# Patient Record
Sex: Female | Born: 1945 | Race: Asian | Hispanic: No | Marital: Single | Smoking: Never smoker
Health system: Southern US, Community
[De-identification: ages and names within clinical notes are randomized; demographics above are authoritative.]

## PROBLEM LIST (undated history)

## (undated) DIAGNOSIS — E079 Disorder of thyroid, unspecified: Secondary | ICD-10-CM

## (undated) DIAGNOSIS — I10 Essential (primary) hypertension: Secondary | ICD-10-CM

## (undated) HISTORY — PX: ABDOMINAL HYSTERECTOMY: SHX81

---

## 2019-11-24 LAB — EXTERNAL GENERIC LAB PROCEDURE

## 2020-01-16 LAB — EXTERNAL GENERIC LAB PROCEDURE: COLOGUARD: NEGATIVE

## 2020-02-19 ENCOUNTER — Emergency Department
Admission: RE | Admit: 2020-02-19 | Discharge: 2020-02-19 | Disposition: A | Discharge status: Home / Self Care | Payer: Self-pay | Source: Ambulatory Visit | Attending: Family Medicine | Admitting: Family Medicine

## 2020-02-19 ENCOUNTER — Other Ambulatory Visit: Payer: Self-pay

## 2020-02-19 VITALS — BP 166/74 | HR 88 | Temp 97.7°F | Resp 16 | Ht 64.0 in | Wt 133.0 lb

## 2020-02-19 DIAGNOSIS — R0981 Nasal congestion: Secondary | ICD-10-CM | POA: Diagnosis not present

## 2020-02-19 HISTORY — DX: Essential (primary) hypertension: I10

## 2020-02-19 NOTE — ED Triage Notes (Signed)
Here for intermittent congestion over past month that clears with ibuprofen; started again 5 days ago. Denies fever or other symptoms. Has had covid and influenza vaccinations.

## 2020-02-19 NOTE — Discharge Instructions (Addendum)
Try taking Zyrtec 10mg  once daily, or Flonase nasal spray, 2 sprays in each nostril once daily (may also use both together).

## 2020-02-19 NOTE — ED Provider Notes (Signed)
[name]    CSN: [id number] Arrival date & time: 02/19/20  1355      History   Chief Complaint Chief Complaint  Patient presents with  . Nasal Congestion    HPI Laura Coleman is a 73 y.o. female.   Patient complains of intermittent nasal congestion that clears with ibuprofen for about a month.  Her symptoms recurred five days ago and have persisted.  She feels well otherwise without sore throat, facial pain, fever, cough, etc.  The history is provided by the patient.    Past Medical History:  Diagnosis Date  . Hypertension     There are no problems to display for this patient.   History reviewed. No pertinent surgical history.  OB History   No obstetric history on file.      Home Medications    Prior to Admission medications   Medication Sig Start Date End Date Taking? Authorizing Provider  hydrochlorothiazide (MICROZIDE) 12.5 MG capsule Take 12.5 mg by mouth daily.   Yes [provider]    Family History No family history on file.  Social History Social History   Tobacco Use  . Smoking status: Not on file  Substance Use Topics  . Alcohol use: Not on file  . Drug use: Not on file     Allergies   Patient has no known allergies.   Review of Systems Review of Systems No sore throat No cough No pleuritic pain No wheezing + nasal congestion No post-nasal drainage No sinus pain/pressure No itchy/red eyes No earache No hemoptysis No SOB No fever/chills No nausea No vomiting No abdominal pain No diarrhea No urinary symptoms No skin rash No fatigue No myalgias No headache   Physical Exam Triage Vital Signs ED Triage Vitals  Enc Vitals Group     BP 02/19/20 1416 (!) 192/84     Pulse Rate 02/19/20 1416 88     Resp 02/19/20 1416 16     Temp 02/19/20 1416 97.7 F (36.5 C)     Temp src --      SpO2 02/19/20 1416 100 %     Weight 02/19/20 1417 133 lb (60.3 kg)     Height 02/19/20 1417 5\' 4"  (1.626 m)     Head  Circumference --      Peak Flow --      Pain Score 02/19/20 1416 0     Pain Loc --      Pain Edu? --      Excl. in GC? --    No data found.  Updated Vital Signs BP (!) 166/74 (BP Location: Right Arm)   Pulse 88   Temp 97.7 F (36.5 C)   Resp 16   Ht 5\' 4"  (1.626 m)   Wt 60.3 kg   LMP 02/19/2020   SpO2 100%   BMI 22.83 kg/m   Visual Acuity Right Eye Distance:   Left Eye Distance:   Bilateral Distance:    Right Eye Near:   Left Eye Near:    Bilateral Near:     Physical Exam Nursing notes and Vital Signs reviewed. Appearance:  Patient appears stated age, and in no acute distress Eyes:  Pupils are equal, round, and reactive to light and accomodation.  Extraocular movement is intact.  Conjunctivae are not inflamed  Ears:  Canals normal.  Tympanic membranes normal.  Nose:  Mildly congested turbinates.  No sinus tenderness.  Pharynx:  Normal Neck:  Supple. No adenopathy. Lungs:  Clear to  auscultation.  Breath sounds are equal.  Moving air well. Heart:  Regular rate and rhythm without murmurs, rubs, or gallops.  Abdomen:  Nontender without masses or hepatosplenomegaly.  Bowel sounds are present.  No CVA or flank tenderness.  Extremities:  No edema.  Skin:  No rash present.   UC Treatments / Results  Labs (all labs ordered are listed, but only abnormal results are displayed) Labs Reviewed - No data to display  EKG   Radiology No results found.  Procedures Procedures (including critical care time)  Medications Ordered in UC Medications - No data to display  Initial Impression / Assessment and Plan / UC Course  I have reviewed the triage vital signs and the nursing notes.  Pertinent labs & imaging results that were available during my care of the patient were reviewed by me and considered in my medical decision making (see chart for details).    Suspect allergic rhinitis. Followup with ENT if not improving.   Final Clinical Impressions(s) / UC Diagnoses    Final diagnoses:  Nasal congestion     Discharge Instructions     Try taking Zyrtec 10mg  once daily, or Flonase nasal spray, 2 sprays in each nostril once daily (may also use both together).    ED Prescriptions    None        , MD 02/21/20 1236

## 2020-07-27 ENCOUNTER — Ambulatory Visit: Payer: Medicare Other

## 2021-05-07 ENCOUNTER — Emergency Department
Admission: RE | Admit: 2021-05-07 | Discharge: 2021-05-07 | Disposition: A | Discharge status: Home / Self Care | Payer: Medicare Other | Source: Ambulatory Visit

## 2021-05-07 ENCOUNTER — Other Ambulatory Visit: Payer: Self-pay

## 2021-05-07 VITALS — BP 162/82 | HR 93 | Temp 98.6°F | Resp 20 | Ht 64.0 in | Wt 133.0 lb

## 2021-05-07 DIAGNOSIS — R059 Cough, unspecified: Secondary | ICD-10-CM | POA: Diagnosis not present

## 2021-05-07 MED ORDER — BENZONATATE 200 MG PO CAPS: 200.0000 mg | ORAL_CAPSULE | Freq: Three times a day (TID) | ORAL | 0 refills | Status: AC | PRN

## 2021-05-07 MED ORDER — AMOXICILLIN-POT CLAVULANATE 875-125 MG PO TABS: 1.0000 | ORAL_TABLET | Freq: Two times a day (BID) | ORAL | 0 refills | Status: DC

## 2021-05-07 NOTE — ED Provider Notes (Signed)
[facility]    CSN: [id number] Arrival date & time: 05/07/21  1149      History   Chief Complaint Chief Complaint  Patient presents with   Cough    HPI Laura Coleman is a 76 y.o. female.   HPI 76 year old female presents with cough for 6 days.  Denies fever.  Reports mild nasal congestion during this time and negative home COVID-19 test.  PMH significant for HTN.  Past Medical History:  Diagnosis Date   Hypertension     There are no problems to display for this patient.   History reviewed. No pertinent surgical history.  OB History   No obstetric history on file.      Home Medications    Prior to Admission medications   Medication Sig Start Date End Date Taking? Authorizing Provider  amoxicillin-clavulanate (AUGMENTIN) 875-125 MG tablet Take 1 tablet by mouth every 12 (twelve) hours. 05/07/21  Yes Trevor Iha, FNP  benzonatate (TESSALON) 200 MG capsule Take 1 capsule (200 mg total) by mouth 3 (three) times daily as needed for up to 7 days for cough. 05/07/21 05/14/21 Yes Trevor Iha, FNP  hydrochlorothiazide (MICROZIDE) 12.5 MG capsule Take 12.5 mg by mouth daily.    [provider]  losartan (COZAAR) 25 MG tablet Take 25 mg by mouth daily. 04/30/21   [provider]    Family History Family History  Problem Relation Age of Onset   Cancer Mother    Osteoporosis Mother    Pneumonia Father     Social History Social History   Tobacco Use   Smoking status: Never   Smokeless tobacco: Never  Vaping Use   Vaping Use: Never used  Substance Use Topics   Alcohol use: Not Currently   Drug use: Not Currently     Allergies   Patient has no known allergies.   Review of Systems Review of Systems  HENT:  Positive for congestion.   Respiratory:  Positive for cough.   All other systems reviewed and are negative.   Physical Exam Triage Vital Signs ED Triage Vitals  Enc Vitals Group     BP 05/07/21 1203 (!) 189/81     Pulse  Rate 05/07/21 1203 93     Resp 05/07/21 1203 20     Temp 05/07/21 1203 98.6 F (37 C)     Temp Source 05/07/21 1203 Oral     SpO2 05/07/21 1203 100 %     Weight 05/07/21 1200 133 lb (60.3 kg)     Height 05/07/21 1200 5\' 4"  (1.626 m)     Head Circumference --      Peak Flow --      Pain Score 05/07/21 1200 0     Pain Loc --      Pain Edu? --      Excl. in GC? --    No data found.  Updated Vital Signs BP (!) 162/82 (BP Location: Right Arm)    Pulse 93    Temp 98.6 F (37 C) (Oral)    Resp 20    Ht 5\' 4"  (1.626 m)    Wt 133 lb (60.3 kg)    LMP 02/19/2020    SpO2 100%    BMI 22.83 kg/m      Physical Exam Vitals and nursing note reviewed.  Constitutional:      General: She is not in acute distress.    Appearance: Normal appearance. She is normal weight. She is not ill-appearing.  HENT:  Head: Normocephalic and atraumatic.     Right Ear: Tympanic membrane, ear canal and external ear normal.     Left Ear: Tympanic membrane, ear canal and external ear normal.     Nose: Nose normal.     Mouth/Throat:     Mouth: Mucous membranes are moist.     Pharynx: Oropharynx is clear.  Eyes:     Extraocular Movements: Extraocular movements intact.     Conjunctiva/sclera: Conjunctivae normal.     Pupils: Pupils are equal, round, and reactive to light.  Cardiovascular:     Rate and Rhythm: Normal rate and regular rhythm.     Pulses: Normal pulses.     Heart sounds: Normal heart sounds.  Pulmonary:     Effort: Pulmonary effort is normal.     Breath sounds: Normal breath sounds. No wheezing, rhonchi or rales.     Comments: Infrequent nonproductive cough noted on exam Musculoskeletal:     Cervical back: Normal range of motion and neck supple.  Skin:    General: Skin is warm and dry.  Neurological:     General: No focal deficit present.     Mental Status: She is alert and oriented to person, place, and time. Mental status is at baseline.     UC Treatments / Results  Labs (all labs  ordered are listed, but only abnormal results are displayed) Labs Reviewed - No data to display  EKG   Radiology No results found.  Procedures Procedures (including critical care time)  Medications Ordered in UC Medications - No data to display  Initial Impression / Assessment and Plan / UC Course  I have reviewed the triage vital signs and the nursing notes.  Pertinent labs & imaging results that were available during my care of the patient were reviewed by me and considered in my medical decision making (see chart for details).     MDM: 1. Cough-Rx'd Augmentin and Tessalon Perles. Advised patient to take medication as directed with food to completion.  Advised patient may take Tessalon Perles daily or as needed for cough.  Encouraged patient to increase daily water intake while taking these medications.  Patient discharged home, hemodynamically stable. Final Clinical Impressions(s) / UC Diagnoses   Final diagnoses:  Cough, unspecified type     Discharge Instructions      Advised patient to take medication as directed with food to completion.  Advised patient may take Tessalon Perles daily or as needed for cough.  Encouraged patient increase daily water intake while taking these medications.     ED Prescriptions     Medication Sig Dispense Auth. Provider   amoxicillin-clavulanate (AUGMENTIN) 875-125 MG tablet Take 1 tablet by mouth every 12 (twelve) hours. 14 tablet Trevor Iha, FNP   benzonatate (TESSALON) 200 MG capsule Take 1 capsule (200 mg total) by mouth 3 (three) times daily as needed for up to 7 days for cough. 40 capsule Trevor Iha, FNP      PDMP not reviewed this encounter.   Trevor Iha, FNP 05/07/21 1301

## 2021-05-07 NOTE — ED Triage Notes (Signed)
Pt presents to Urgent Care with c/o cough x 6 days. Afebrile. Also c/o mild nasal congestion. Reports negative home COVID test.

## 2021-05-07 NOTE — Discharge Instructions (Addendum)
Advised patient to take medication as directed with food to completion.  Advised patient may take Tessalon Perles daily or as needed for cough.  Encouraged patient increase daily water intake while taking these medications.

## 2021-07-08 ENCOUNTER — Emergency Department (HOSPITAL_COMMUNITY)
Admission: EM | Admit: 2021-07-08 | Discharge: 2021-07-09 | Discharge status: Against Medical Advice | Payer: Medicare Other | Attending: Emergency Medicine | Admitting: Emergency Medicine

## 2021-07-08 ENCOUNTER — Other Ambulatory Visit: Payer: Self-pay

## 2021-07-08 ENCOUNTER — Encounter (HOSPITAL_COMMUNITY): Payer: Self-pay

## 2021-07-08 ENCOUNTER — Emergency Department (HOSPITAL_COMMUNITY): Payer: Medicare Other

## 2021-07-08 DIAGNOSIS — S80819A Abrasion, unspecified lower leg, initial encounter: Secondary | ICD-10-CM | POA: Insufficient documentation

## 2021-07-08 DIAGNOSIS — Y92481 Parking lot as the place of occurrence of the external cause: Secondary | ICD-10-CM | POA: Diagnosis not present

## 2021-07-08 DIAGNOSIS — M25561 Pain in right knee: Secondary | ICD-10-CM | POA: Diagnosis not present

## 2021-07-08 DIAGNOSIS — Z5321 Procedure and treatment not carried out due to patient leaving prior to being seen by health care provider: Secondary | ICD-10-CM | POA: Insufficient documentation

## 2021-07-08 DIAGNOSIS — S8990XA Unspecified injury of unspecified lower leg, initial encounter: Secondary | ICD-10-CM | POA: Diagnosis present

## 2021-07-08 LAB — URINALYSIS, ROUTINE W REFLEX MICROSCOPIC
Bilirubin Urine: NEGATIVE
Glucose, UA: NEGATIVE mg/dL
Ketones, ur: NEGATIVE mg/dL
Nitrite: NEGATIVE
Protein, ur: NEGATIVE mg/dL
Specific Gravity, Urine: 1.008 (ref 1.005–1.030)
pH: 6 (ref 5.0–8.0)

## 2021-07-08 LAB — BASIC METABOLIC PANEL
Anion gap: 9 (ref 5–15)
BUN: 19 mg/dL (ref 8–23)
CO2: 23 mmol/L (ref 22–32)
Calcium: 8.9 mg/dL (ref 8.9–10.3)
Chloride: 102 mmol/L (ref 98–111)
Creatinine, Ser: 1.05 mg/dL — ABNORMAL HIGH (ref 0.44–1.00)
GFR, Estimated: 55 mL/min — ABNORMAL LOW (ref >60)
Glucose, Bld: 146 mg/dL — ABNORMAL HIGH (ref 70–99)
Potassium: 3.8 mmol/L (ref 3.5–5.1)
Sodium: 134 mmol/L — ABNORMAL LOW (ref 135–145)

## 2021-07-08 LAB — CBC
HCT: 39.2 % (ref 36.0–46.0)
Hemoglobin: 12.9 g/dL (ref 12.0–15.0)
MCH: 28.7 pg (ref 26.0–34.0)
MCHC: 32.9 g/dL (ref 30.0–36.0)
MCV: 87.3 fL (ref 80.0–100.0)
Platelets: 294 10*3/uL (ref 150–400)
RBC: 4.49 MIL/uL (ref 3.87–5.11)
RDW: 13.2 % (ref 11.5–15.5)
WBC: 16.5 10*3/uL — ABNORMAL HIGH (ref 4.0–10.5)
nRBC: 0 % (ref 0.0–0.2)
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
## 2021-07-09 ENCOUNTER — Emergency Department
Admission: EM | Admit: 2021-07-09 | Discharge: 2021-07-09 | Disposition: A | Discharge status: Home / Self Care | Payer: Medicare Other | Source: Home / Self Care | Attending: Family Medicine | Admitting: Family Medicine

## 2021-07-09 ENCOUNTER — Ambulatory Visit: Payer: Self-pay

## 2021-07-09 DIAGNOSIS — M7989 Other specified soft tissue disorders: Secondary | ICD-10-CM

## 2021-07-09 DIAGNOSIS — M25561 Pain in right knee: Secondary | ICD-10-CM

## 2021-07-09 DIAGNOSIS — Z23 Encounter for immunization: Secondary | ICD-10-CM

## 2021-07-09 DIAGNOSIS — S8701XA Crushing injury of right knee, initial encounter: Secondary | ICD-10-CM | POA: Diagnosis not present

## 2021-07-09 MED ORDER — TETANUS-DIPHTH-ACELL PERTUSSIS 5-2.5-18.5 LF-MCG/0.5 IM SUSY
0.5000 mL | PREFILLED_SYRINGE | Freq: Once | INTRAMUSCULAR | Status: AC
Start: 2021-07-09 — End: 2021-07-09
  Administered 2021-07-09: 0.5 mL via INTRAMUSCULAR

## 2021-07-09 MED ORDER — MUPIROCIN 2 % EX OINT: 1.0000 "application " | TOPICAL_OINTMENT | Freq: Two times a day (BID) | CUTANEOUS | 0 refills | Status: DC
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
## 2021-07-09 NOTE — ED Notes (Signed)
Patient states she is leaving d/t wait time 

## 2021-07-09 NOTE — Discharge Instructions (Addendum)
Limit your walking.  Walk only as much as you must to get meals and use the bathroom ?Spend most of your time reclining with your leg elevated above the level of your heart ?Gently move the joints of your leg, knee and ankle, to keep motion ?May use warm compresses to area ?Cleaned the open wounds twice a day and apply antibiotic ointment ?See Dr. Jordan Likes next week ?GO TO ER if the foot becomes numb or blue ?
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
## 2021-07-14 ENCOUNTER — Encounter: Payer: Self-pay | Admitting: Family Medicine

## 2021-07-14 ENCOUNTER — Ambulatory Visit: Payer: Self-pay

## 2021-07-14 ENCOUNTER — Ambulatory Visit: Payer: Medicare Other | Admitting: Family Medicine

## 2021-07-14 VITALS — BP 128/80 | Ht 64.0 in | Wt 130.0 lb

## 2021-07-14 DIAGNOSIS — M25561 Pain in right knee: Secondary | ICD-10-CM

## 2021-07-14 DIAGNOSIS — S8781XD Crushing injury of right lower leg, subsequent encounter: Secondary | ICD-10-CM | POA: Insufficient documentation

## 2021-07-14 DIAGNOSIS — T148XXA Other injury of unspecified body region, initial encounter: Secondary | ICD-10-CM

## 2021-07-14 DIAGNOSIS — M25461 Effusion, right knee: Secondary | ICD-10-CM | POA: Diagnosis not present

## 2021-07-14 DIAGNOSIS — S8781XA Crushing injury of right lower leg, initial encounter: Secondary | ICD-10-CM | POA: Diagnosis not present

## 2021-07-14 NOTE — Assessment & Plan Note (Signed)
Initial injury on 4/5.  Having improving this and swelling.  Denies any paresthesia in the lower leg.  Does have some chronic kidney disease from the lab work obtained in the emergency department. ?-Counseled on home exercise therapy and supportive care. ?-CMP, CBC, CK, magnesium  ?-Counseled on need for close follow-up ?

## 2021-07-14 NOTE — Patient Instructions (Signed)
Nice to meet you ?Please try light heat  ?Please try the to do normal activities  ?I will call with the lab results.   ?Please send me a message in MyChart with any questions or updates.  ?Please see me back in 1-2 weeks.  ? ?--Dr. Jordan Likes ? ?
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
## 2021-07-14 NOTE — Assessment & Plan Note (Signed)
Acutely occurring around the knee since her injury.  Does not appear to be infected on exam. ?-Counseled on home exercise therapy and supportive care. ?-Could consider aspiration. ?

## 2021-07-14 NOTE — Assessment & Plan Note (Signed)
Acutely occurring.  Does have a mild effusion but does not appear to be the source of her pain following the trauma.  She has limited range of motion today. ?-Counseled on home exercise therapy and supportive care. ?-Could consider physical therapy or injection. ?

## 2021-07-15 LAB — COMPREHENSIVE METABOLIC PANEL
ALT: 16 IU/L (ref 0–32)
AST: 27 IU/L (ref 0–40)
Albumin/Globulin Ratio: 1.7 (ref 1.2–2.2)
Albumin: 4.2 g/dL (ref 3.7–4.7)
Alkaline Phosphatase: 103 IU/L (ref 44–121)
BUN/Creatinine Ratio: 13 (ref 12–28)
BUN: 12 mg/dL (ref 8–27)
Bilirubin Total: 0.8 mg/dL (ref 0.0–1.2)
CO2: 23 mmol/L (ref 20–29)
Calcium: 9.6 mg/dL (ref 8.7–10.3)
Chloride: 102 mmol/L (ref 96–106)
Creatinine, Ser: 0.9 mg/dL (ref 0.57–1.00)
Globulin, Total: 2.5 g/dL (ref 1.5–4.5)
Glucose: 108 mg/dL — ABNORMAL HIGH (ref 70–99)
Potassium: 4.8 mmol/L (ref 3.5–5.2)
Sodium: 140 mmol/L (ref 134–144)
Total Protein: 6.7 g/dL (ref 6.0–8.5)
eGFR: 66 mL/min/{1.73_m2} (ref >59)

## 2021-07-15 LAB — CBC WITH DIFFERENTIAL
Basophils Absolute: 0 10*3/uL (ref 0.0–0.2)
Basos: 0 %
EOS (ABSOLUTE): 0.1 10*3/uL (ref 0.0–0.4)
Eos: 1 %
Hematocrit: 34.2 % (ref 34.0–46.6)
Hemoglobin: 11.6 g/dL (ref 11.1–15.9)
Immature Grans (Abs): 0.1 10*3/uL (ref 0.0–0.1)
Immature Granulocytes: 1 %
Lymphocytes Absolute: 1.2 10*3/uL (ref 0.7–3.1)
Lymphs: 13 %
MCH: 28.6 pg (ref 26.6–33.0)
MCHC: 33.9 g/dL (ref 31.5–35.7)
MCV: 84 fL (ref 79–97)
Monocytes Absolute: 0.6 10*3/uL (ref 0.1–0.9)
Monocytes: 6 %
Neutrophils Absolute: 7.7 10*3/uL — ABNORMAL HIGH (ref 1.4–7.0)
Neutrophils: 79 %
RBC: 4.06 x10E6/uL (ref 3.77–5.28)
RDW: 13.2 % (ref 11.7–15.4)
WBC: 9.6 10*3/uL (ref 3.4–10.8)

## 2021-07-15 LAB — MAGNESIUM: Magnesium: 2.3 mg/dL (ref 1.6–2.3)

## 2021-07-15 LAB — CK: Total CK: 85 U/L (ref 32–182)

## 2021-07-20 ENCOUNTER — Telehealth: Payer: Self-pay | Admitting: Family Medicine

## 2021-07-20 NOTE — Telephone Encounter (Signed)
Left VM for patient. If she calls back please have her speak with a nurse/CMA and inform that her lab values are reassuring and we'll follow up as scheduled.  ? ?If any questions then please take the best time and phone number to call and I will try to call her back.  ? ?Rosemarie Ax, MD ?[facility] ?07/20/2021, 1:00 PM ? ? ?

## 2021-07-20 NOTE — Telephone Encounter (Signed)
Pt informed of below.  

## 2021-07-20 NOTE — Telephone Encounter (Signed)
Pt called for lab results--- Forwarding message to med asst to contact pt if Labs cleared by provider. ? ?--glh ?

## 2021-07-21 ENCOUNTER — Ambulatory Visit: Payer: Medicare Other | Admitting: Family Medicine

## 2021-07-23 ENCOUNTER — Ambulatory Visit: Payer: Medicare Other | Admitting: Family Medicine

## 2021-07-23 ENCOUNTER — Encounter: Payer: Self-pay | Admitting: Family Medicine

## 2021-07-23 ENCOUNTER — Ambulatory Visit: Payer: Self-pay

## 2021-07-23 DIAGNOSIS — T148XXA Other injury of unspecified body region, initial encounter: Secondary | ICD-10-CM | POA: Diagnosis not present

## 2021-07-23 DIAGNOSIS — S8781XD Crushing injury of right lower leg, subsequent encounter: Secondary | ICD-10-CM | POA: Diagnosis not present

## 2021-07-23 NOTE — Assessment & Plan Note (Signed)
Acutely occurring.  Lab work was reassuring with a normal CK.  She does have a wound on the medial and lateral aspect around the knee which shows good granulation tissue.  No signs of infection. ?-Counseled on home exercise therapy and supportive care. ?-Referral to wound care center for evaluation of each wound due to their fairly large size and possible need for debridement and maintenance. ?

## 2021-07-23 NOTE — Assessment & Plan Note (Signed)
Still acutely occurring.  Able to aspirate a fair amount today. ?-Counseled on home exercise therapy and supportive care. ?-Aspiration. ?-Referral to physical therapy. ? ?

## 2021-07-23 NOTE — Patient Instructions (Signed)
Good to see you ?Continue to be active  ?We have made a referral to physical therapy  ?We have made a referral to wound care center   ?Please send me a message in MyChart with any questions or updates.  ?Please see me back in 3 weeks.  ? ?--Dr. Jordan Likes ? ?

## 2021-07-23 NOTE — Progress Notes (Signed)
?  Laura Coleman - 76 y.o. female MRN [id number]  Date of birth: 06-12-1945 ? ?SUBJECTIVE:  Including CC & ROS.  ?No chief complaint on file. ? ? ?Laura Coleman is a 76 y.o. female that is following up for her right leg pain, hematoma and crush injury.  She has slowly gotten improvement with the pain and swelling.  She is still having some pain over the medial aspect of the knee.  Her wounds are continuing to heal. ? ? ?Review of Systems ?See HPI  ? ?HISTORY: Past Medical, Surgical, Social, and Family History Reviewed & Updated per EMR.   ?Pertinent Historical Findings include: ? ?Past Medical History:  ?Diagnosis Date  ? Hypertension   ? ? ?History reviewed. No pertinent surgical history. ? ? ?PHYSICAL EXAM:  ?VS: LMP 02/19/2020  ?Physical Exam ?Gen: NAD, alert, cooperative with exam, well-appearing ?MSK:  ?Neurovascularly intact   ? ? ?Aspiration/Injection Procedure Note ?Ariannah Arenson ?[birth year] ? ?Procedure: Aspiration ?Indications: Right knee pain ? ?Procedure Details ?Consent: Risks of procedure as well as the alternatives and risks of each were explained to the (patient/caregiver).  Consent for procedure obtained. ?Time Out: Verified patient identification, verified procedure, site/side was marked, verified correct patient position, special equipment/implants available, medications/allergies/relevent history reviewed, required imaging and test results available.  Performed.  The area was cleaned with iodine and alcohol swabs.   ? ?The right medial knee soft tissue was injected using 3 cc's of 1% lidocaine with a 25 1 1/2" needle.  An 18-gauge 1 and 1 officiated was used to achieve aspiration.  Ultrasound was used. Images were obtained in long views showing the injection.   ? ?Amount of Fluid Aspirated:  95mL ?Character of Fluid: bloody ?Fluid was sent for: n/a ? ?A sterile dressing was applied. ? ?Patient did tolerate procedure well. ? ? ? ? ?ASSESSMENT & PLAN:  ? ?Hematoma ?Still acutely occurring.  Able to aspirate a  fair amount today. ?-Counseled on home exercise therapy and supportive care. ?-Aspiration. ?-Referral to physical therapy. ? ? ?Crush injury, leg, lower, right, subsequent encounter ?Acutely occurring.  Lab work was reassuring with a normal CK.  She does have a wound on the medial and lateral aspect around the knee which shows good granulation tissue.  No signs of infection. ?-Counseled on home exercise therapy and supportive care. ?-Referral to wound care center for evaluation of each wound due to their fairly large size and possible need for debridement and maintenance. ? ? ? ? ?

## 2021-07-28 ENCOUNTER — Ambulatory Visit: Payer: Medicare Other | Attending: Family Medicine | Admitting: Rehabilitative and Restorative Service Providers"

## 2021-07-28 ENCOUNTER — Telehealth: Payer: Self-pay | Admitting: Family Medicine

## 2021-07-28 DIAGNOSIS — S8001XA Contusion of right knee, initial encounter: Secondary | ICD-10-CM | POA: Diagnosis not present

## 2021-07-28 DIAGNOSIS — M25461 Effusion, right knee: Secondary | ICD-10-CM | POA: Insufficient documentation

## 2021-07-28 DIAGNOSIS — Y929 Unspecified place or not applicable: Secondary | ICD-10-CM | POA: Diagnosis not present

## 2021-07-28 DIAGNOSIS — R29898 Other symptoms and signs involving the musculoskeletal system: Secondary | ICD-10-CM | POA: Diagnosis not present

## 2021-07-28 DIAGNOSIS — S8701XA Crushing injury of right knee, initial encounter: Secondary | ICD-10-CM | POA: Diagnosis not present

## 2021-07-28 DIAGNOSIS — M25561 Pain in right knee: Secondary | ICD-10-CM | POA: Insufficient documentation

## 2021-07-28 DIAGNOSIS — T148XXA Other injury of unspecified body region, initial encounter: Secondary | ICD-10-CM | POA: Diagnosis present

## 2021-07-28 DIAGNOSIS — R600 Localized edema: Secondary | ICD-10-CM | POA: Diagnosis not present

## 2021-07-28 DIAGNOSIS — Y939 Activity, unspecified: Secondary | ICD-10-CM | POA: Diagnosis not present

## 2021-07-28 DIAGNOSIS — M25661 Stiffness of right knee, not elsewhere classified: Secondary | ICD-10-CM | POA: Insufficient documentation
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
## 2021-07-28 NOTE — Telephone Encounter (Signed)
Per pt no contact from [facility]--faxing order & OV notes to [phone] instead of [phone] non working no#s. ? ?--Advised pt to contact [facility] if no cll by end of week. ? ?--Just an FYI  ? ?-glh ?

## 2021-07-28 NOTE — Patient Instructions (Signed)
Access Code: [email or web address] ?URL: [email or web address] ?Date: 07/28/2021 ?Prepared by: Corlis Leak ? ?Exercises ?- Hooklying Hamstring Stretch with Strap  - 2 x daily - 7 x weekly - 1 sets - 3 reps - 30 sec  hold ?- Supine ITB Stretch with Strap  - 2 x daily - 7 x weekly - 1 sets - 3 reps - 30 sec  hold ?- Supine Piriformis Stretch with Leg Straight  - 2 x daily - 7 x weekly - 1 sets - 3 reps - 30 sec  hold ?- Hip Flexor Stretch at Edge of Bed  - 2 x daily - 7 x weekly - 1 sets - 3 reps - 30 sec  hold ?- Hooklying Single Knee to Chest Stretch  - 2 x daily - 7 x weekly - 1 sets - 3-5 reps - 10-15 sec  hold ?- Supine Quad Set  - 2 x daily - 7 x weekly - 1 sets - 10 reps - 3 sec  hold ?- Small Range Straight Leg Raise  - 2 x daily - 7 x weekly - 1 sets - 10 reps - 5 sec  hold ?- Ankle Pumps in Elevation  - 2 x daily - 7 x weekly - 1 sets - 10-20 reps ?- Ankle Circles in Elevation  - 2 x daily - 7 x weekly - 1 sets - 10-15 reps ?- Ankle Alphabet in Elevation  - 2 x daily - 7 x weekly - 1 sets - 3 reps ?

## 2021-07-30 ENCOUNTER — Encounter: Payer: Self-pay | Admitting: Rehabilitative and Restorative Service Providers"

## 2021-07-30 ENCOUNTER — Ambulatory Visit: Payer: Medicare Other | Admitting: Rehabilitative and Restorative Service Providers"

## 2021-07-30 DIAGNOSIS — R29898 Other symptoms and signs involving the musculoskeletal system: Secondary | ICD-10-CM

## 2021-07-30 DIAGNOSIS — M25661 Stiffness of right knee, not elsewhere classified: Secondary | ICD-10-CM

## 2021-07-30 DIAGNOSIS — S8001XA Contusion of right knee, initial encounter: Secondary | ICD-10-CM | POA: Diagnosis not present

## 2021-07-30 NOTE — Therapy (Signed)
[place] ?[facility] ?[address] [place] [address] ?[place], [place], [address] ?Phone: [phone]   Fax:  [phone] ? ?Physical Therapy Treatment ? ?Patient Details  ?Name: Laura Coleman ?MRN: [id number] ?Date of Birth: 1945/12/09 ?Referring Provider (PT): Dr Binnie Rail ? ? ?Encounter Date: 07/30/2021 ? ? PT End of Session - 07/30/21 3614   ? ? Visit Number 2   ? Number of Visits 12   ? Date for PT Re-Evaluation 09/08/21   ? Progress Note Due on Visit 10   ? PT Start Time [phone]   ? PT Stop Time 0930   ? PT Time Calculation (min) 40 min   ? Activity Tolerance Patient tolerated treatment well   ? ?  ?  ? ?  ? ? ?Past Medical History:  ?Diagnosis Date  ? Hypertension   ? ? ?History reviewed. No pertinent surgical history. ? ?There were no vitals filed for this visit. ? ? Subjective Assessment - 07/30/21 0857   ? ? Subjective Working on her exercises at home. Not as bad in the morning but worse at night. Leg feels tight. Patient has an appointment with wound care center today.   ? Currently in Pain? No/denies   ? Pain Score 0-No pain   ? ?  ?  ? ?  ? ? ? ? ? ? ? ? ? ? ? ? ? ? ? ? ? ? ? ? [facility] Adult PT Treatment/Exercise - 07/30/21 0001   ? ?  ? Knee/Hip Exercises: Stretches  ? Passive Hamstring Stretch Right;2 reps;30 seconds   ? Passive Hamstring Stretch Limitations supine with strap   ? Hip Flexor Stretch Right;3 reps;30 seconds   ? Hip Flexor Stretch Limitations sitting   ? Knee: Self-Stretch to increase Flexion Right;5 reps;10 seconds   ? Knee: Self-Stretch Limitations supine knee to chest   ? ITB Stretch Right;2 reps;30 seconds   ? ITB Stretch Limitations supine with strap   ? Piriformis Stretch Right;2 reps;30 seconds   ? Piriformis Stretch Limitations supine travell   ? Gastroc Stretch Right;2 reps;30 seconds   ? Soleus Stretch Right;2 reps;30 seconds   ?  ? Knee/Hip Exercises: Aerobic  ? Nustep L5 x 6 min UE 10   ?  ? Knee/Hip Exercises: Standing  ? Functional Squat 10 reps;3  seconds   ? SLS 20 sec x 3 reps Rt LE   ?  ? Knee/Hip Exercises: Supine  ? Quad Sets AROM;Strengthening;Right;10 reps   ? Quad Sets Limitations 5 sec hold   ? Bridges Strengthening;Both;10 reps   5 sec hold  ? Straight Leg Raises AROM;Strengthening;Right;10 reps   ? Straight Leg Raises Limitations 5 sec hold 10 inch lift   ? Other Supine Knee/Hip Exercises ankle pumps; ankle circles x 10-20 reps each; alphabet   ? ?  ?  ? ?  ? ? ? ? ? ? ? ? ? ? ? ? ? ? ? PT Long Term Goals - 07/28/21 1018   ? ?  ? PT LONG TERM GOAL #1  ? Title Increase AROM Rt knee to 0 degrees extension   ? Time 6   ? Period Weeks   ? Status New   ? Target Date 09/08/21   ?  ? PT LONG TERM GOAL #2  ? Title Improve functional strength with patient reporting return to full normal activites   ? Time 6   ? Period Weeks   ? Status New   ? Target Date 09/08/21   ?  ?  PT LONG TERM GOAL #3  ? Title Independent in HEP   ? Time 6   ? Period Weeks   ? Status New   ? Target Date 09/08/21   ? ?  ?  ? ?  ? ? ? ? ? ? ? ? Plan - 07/30/21 0858   ? ? Clinical Impression Statement Patient is working on exercises at home. She has improved color and decreased edema Lt LE compared to initial visit. Continued with ROM and strengthening exercises with correction and review as indicated.   ? Rehab Potential Good   ? PT Frequency 2x / week   ? PT Duration 6 weeks   ? PT Treatment/Interventions ADLs/Self Care Home Management;Aquatic Therapy;Cryotherapy;Electrical Stimulation;Iontophoresis 4mg /ml Dexamethasone;Moist Heat;Ultrasound;Gait training;Stair training;Functional mobility training;Therapeutic activities;Therapeutic exercise;Balance training;Neuromuscular re-education;Patient/family education;Manual techniques;Passive range of motion;Dry needling;Taping;Vasopneumatic Device   ? PT Next Visit Plan review and progress HEP; continue with work to increase AROM Rt hip and knee, improve functional strength, increase functional activity level. Modalities as indicated   ? PT  Home Exercise Plan [email or web address]   ? Consulted and Agree with Plan of Care Patient   ? ?  ?  ? ?  ? ? ?Patient will benefit from skilled therapeutic intervention in order to improve the following deficits and impairments:    ? ?Visit Diagnosis: ?Stiffness of right knee, not elsewhere classified ? ?Other symptoms and signs involving the musculoskeletal system ? ? ? ? ?Problem List ?Patient Active Problem List  ? Diagnosis Date Noted  ? Crush injury, leg, lower, right, subsequent encounter 07/14/2021  ? Hematoma 07/14/2021  ? Effusion of right knee 07/14/2021  ? ? ?09/13/2021, PT, MPH  ?07/30/2021, 9:32 AM ? ?[place] ?[facility] ?[address] ?[place], [place], [place] ?Phone: [phone]   Fax:  [phone] ? ?Name: Shawn Dannenberg ?MRN: [name] ?Date of Birth: [birth year] ? ? ? ?

## 2021-08-04 ENCOUNTER — Encounter: Payer: Self-pay | Admitting: Rehabilitative and Restorative Service Providers"

## 2021-08-04 ENCOUNTER — Ambulatory Visit: Payer: Medicare Other | Attending: Family Medicine | Admitting: Rehabilitative and Restorative Service Providers"

## 2021-08-04 DIAGNOSIS — R29898 Other symptoms and signs involving the musculoskeletal system: Secondary | ICD-10-CM | POA: Diagnosis present

## 2021-08-04 DIAGNOSIS — M25661 Stiffness of right knee, not elsewhere classified: Secondary | ICD-10-CM | POA: Insufficient documentation

## 2021-08-04 NOTE — Patient Instructions (Signed)
Access Code: [email or web address] ?URL: [email or web address] ?Date: 08/04/2021 ?Prepared by: Gillermo Murdoch ? ?Exercises ?- Hooklying Hamstring Stretch with Strap  - 2 x daily - 7 x weekly - 1 sets - 3 reps - 30 sec  hold ?- Supine ITB Stretch with Strap  - 2 x daily - 7 x weekly - 1 sets - 3 reps - 30 sec  hold ?- Supine Piriformis Stretch with Leg Straight  - 2 x daily - 7 x weekly - 1 sets - 3 reps - 30 sec  hold ?- Hip Flexor Stretch at Edge of Bed  - 2 x daily - 7 x weekly - 1 sets - 3 reps - 30 sec  hold ?- Hooklying Single Knee to Chest Stretch  - 2 x daily - 7 x weekly - 1 sets - 3-5 reps - 10-15 sec  hold ?- Supine Quad Set  - 2 x daily - 7 x weekly - 1 sets - 10 reps - 3 sec  hold ?- Small Range Straight Leg Raise  - 2 x daily - 7 x weekly - 1 sets - 10 reps - 5 sec  hold ?- Ankle Pumps in Elevation  - 2 x daily - 7 x weekly - 1 sets - 10-20 reps ?- Ankle Circles in Elevation  - 2 x daily - 7 x weekly - 1 sets - 10-15 reps ?- Ankle Alphabet in Elevation  - 2 x daily - 7 x weekly - 1 sets - 3 reps ?- Supine Quad Set  - 2 x daily - 7 x weekly - 1 sets - 10 reps - 3 sec  hold ?- Seated Hip Flexor Stretch  - 2 x daily - 7 x weekly - 1 sets - 3 reps - 30 sec  hold ?- Gastroc Stretch on Wall  - 2 x daily - 7 x weekly - 1 sets - 3 reps - 30 sec  hold ?- Soleus Stretch on Wall  - 2 x daily - 7 x weekly - 1 sets - 3 reps - 30 sec  hold ?- Single Leg Stance with Support  - 2 x daily - 7 x weekly - 1 sets - 5 reps - 15-20 sec  hold ?- Mini Squat with Counter Support  - 2 x daily - 7 x weekly - 1-2 sets - 10 reps - 3 sec  hold ?- Standing Hip Extension with Counter Support  - 2 x daily - 7 x weekly - 1-2 sets - 10 reps - 3-5 sec  hold ?- Standing Hip Abduction with Counter Support  - 2 x daily - 7 x weekly - 2-3 sets - 10 reps - 2-3 sec  hold ?

## 2021-08-04 NOTE — Therapy (Signed)
[place] ?[facility] ?[address] [place] [address] ?[place], [place], [address] ?Phone: [phone]   Fax:  [phone] ? ?Physical Therapy Treatment ? ?Patient Details  ?Name: Laura Coleman ?MRN: [id number] ?Date of Birth: [birth year] ?Referring Provider (PT): Dr Binnie Rail ? ? ?Encounter Date: 08/04/2021 ? ? PT End of Session - 08/04/21 0936   ? ? Visit Number 3   ? Number of Visits 12   ? Date for PT Re-Evaluation 09/08/21   ? Progress Note Due on Visit 10   ? PT Start Time 0935   ? PT Stop Time 1015   ? PT Time Calculation (min) 40 min   ? Activity Tolerance Patient tolerated treatment well   ? ?  ?  ? ?  ? ? ?Past Medical History:  ?Diagnosis Date  ? Hypertension   ? ? ?History reviewed. No pertinent surgical history. ? ?There were no vitals filed for this visit. ? ? Subjective Assessment - 08/04/21 0939   ? ? Subjective Patient reports that she saw the wound care center Thursday and they debrided the wounds on the Lt knee. She has had increased pain in the Lt knee and she has done less exercises due to the knee pain. Some knee pain after increased activity. No pain today.   ? Currently in Pain? No/denies   ? Pain Score 0-No pain   ? Pain Location Knee   ? Pain Orientation Right   ? Pain Type Acute pain   ? ?  ?  ? ?  ? ? ? ? ? [facility] PT Assessment - 08/04/21 0001   ? ?  ? Assessment  ? Medical Diagnosis Rt knee pain/edea/disfunction   ? Referring Provider (PT) Dr Binnie Rail   ? Onset Date/Surgical Date 07/09/21   ? Hand Dominance Right   ? Next MD Visit 08/24/21   ? Prior Therapy none   ?  ? Observation/Other Assessments  ? Skin Integrity wounds covered in bandage and secured with opsite - dressing dry and intact   ?  ? AROM  ? Right Knee Extension -2   ? Right Knee Flexion 128   ? ?  ?  ? ?  ? ? ? ? ? ? ? ? ? ? ? ? ? ? ? ? [facility] Adult PT Treatment/Exercise - 08/04/21 0001   ? ?  ? Knee/Hip Exercises: Stretches  ? Passive Hamstring Stretch Right;2 reps;30 seconds   ? Passive  Hamstring Stretch Limitations supine with strap   ? Knee: Self-Stretch to increase Flexion Right;5 reps;10 seconds   ? Knee: Self-Stretch Limitations supine knee to chest   ? Gastroc Stretch Right;2 reps;30 seconds   ? Gastroc Stretch Limitations incline board   ? Soleus Stretch Right;2 reps;30 seconds   ? Soleus Stretch Limitations incline board   ?  ? Knee/Hip Exercises: Aerobic  ? Nustep L5 x 6 min UE 10   ?  ? Knee/Hip Exercises: Standing  ? Hip ADduction Strengthening;Right;Left;10 reps   ? Hip ADduction Limitations leading with heel   ? Hip Extension Stengthening;Right;Left;10 reps;Knee straight   ? Wall Squat 10 reps;10 seconds   ? SLS 20 sec x 3 reps Rt LE   ?  ? Knee/Hip Exercises: Supine  ? Bridges Strengthening;Both;10 reps   5 sec hold  ? Straight Leg Raises AROM;Strengthening;Right;10 reps   ? Straight Leg Raises Limitations 5 sec hold 10 inch lift   ? Other Supine Knee/Hip Exercises ankle pumps; ankle circles x 10-20 reps  each; alphabet   ? ?  ?  ? ?  ? ? ? ? ? ? ? ? ? ? PT Education - 08/04/21 1012   ? ? Education Details HEP   ? Person(s) Educated Patient   ? Methods Explanation;Demonstration;Tactile cues;Verbal cues;Handout   ? Comprehension Verbalized understanding;Returned demonstration;Verbal cues required;Tactile cues required   ? ?  ?  ? ?  ? ? ? ? ? ? PT Long Term Goals - 07/28/21 1018   ? ?  ? PT LONG TERM GOAL #1  ? Title Increase AROM Rt knee to 0 degrees extension   ? Time 6   ? Period Weeks   ? Status New   ? Target Date 09/08/21   ?  ? PT LONG TERM GOAL #2  ? Title Improve functional strength with patient reporting return to full normal activites   ? Time 6   ? Period Weeks   ? Status New   ? Target Date 09/08/21   ?  ? PT LONG TERM GOAL #3  ? Title Independent in HEP   ? Time 6   ? Period Weeks   ? Status New   ? Target Date 09/08/21   ? ?  ?  ? ?  ? ? ? ? ? ? ? ? Plan - 08/04/21 0942   ? ? Clinical Impression Statement Patient returns with bandage coving wounds on Lt knee. She saw MD  at wound care center Thursday, 07/30/21 and the wounds were debrided and covered with bandage. Bandages will be removed when patient returns to wound care center Monday, 08/10/21. She has noticed some incresaed pain in the knee with activities, at times. Tolerated exercises well.   ? Rehab Potential Good   ? PT Frequency 2x / week   ? PT Duration 6 weeks   ? PT Treatment/Interventions ADLs/Self Care Home Management;Aquatic Therapy;Cryotherapy;Electrical Stimulation;Iontophoresis 4mg /ml Dexamethasone;Moist Heat;Ultrasound;Gait training;Stair training;Functional mobility training;Therapeutic activities;Therapeutic exercise;Balance training;Neuromuscular re-education;Patient/family education;Manual techniques;Passive range of motion;Dry needling;Taping;Vasopneumatic Device   ? PT Next Visit Plan review and progress HEP; continue with work to increase AROM Rt hip and knee, improve functional strength, increase functional activity level. Modalities as indicated   ? PT Home Exercise Plan [id number]   ? Consulted and Agree with Plan of Care Patient   ? ?  ?  ? ?  ? ? ?Patient will benefit from skilled therapeutic intervention in order to improve the following deficits and impairments:    ? ?Visit Diagnosis: ?Stiffness of right knee, not elsewhere classified ? ?Other symptoms and signs involving the musculoskeletal system ? ? ? ? ?Problem List ?Patient Active Problem List  ? Diagnosis Date Noted  ? Crush injury, leg, lower, right, subsequent encounter 07/14/2021  ? Hematoma 07/14/2021  ? Effusion of right knee 07/14/2021  ? ? ?09/13/2021, PT, MPH ?08/04/2021, 10:14 AM ? ?[place] ?[facility] ?[address] ?[place], [place], [place] ?Phone: [phone]   Fax:  [phone] ? ?Name: Laura Coleman ?MRN: [name] ?Date of Birth: [birth year] ? ? ? ?

## 2021-08-06 ENCOUNTER — Ambulatory Visit: Payer: Medicare Other | Admitting: Rehabilitative and Restorative Service Providers"

## 2021-08-06 ENCOUNTER — Encounter: Payer: Self-pay | Admitting: Rehabilitative and Restorative Service Providers"

## 2021-08-06 DIAGNOSIS — M25661 Stiffness of right knee, not elsewhere classified: Secondary | ICD-10-CM | POA: Diagnosis not present

## 2021-08-06 DIAGNOSIS — R29898 Other symptoms and signs involving the musculoskeletal system: Secondary | ICD-10-CM

## 2021-08-06 NOTE — Therapy (Signed)
[place] ?[facility] ?[address] ?[place], [place], [address] ?Phone: [phone]   Fax:  [phone] ? ?Physical Therapy Treatment ? ?Patient Details  ?Name: Laura Coleman ?MRN: [id number] ?Date of Birth: 1945-06-12 ?Referring Provider (PT): Dr Cherly Hensen ? ? ?Encounter Date: 08/06/2021 ? ? PT End of Session - 08/06/21 0941   ? ? Visit Number 4   ? Number of Visits 12   ? Date for PT Re-Evaluation 09/08/21   ? Progress Note Due on Visit 10   ? PT Start Time 0930   ? PT Stop Time 1012   ? PT Time Calculation (min) 42 min   ? Activity Tolerance Patient tolerated treatment well   ? ?  ?  ? ?  ? ? ?Past Medical History:  ?Diagnosis Date  ? Hypertension   ? ? ?History reviewed. No pertinent surgical history. ? ?There were no vitals filed for this visit. ? ? Subjective Assessment - 08/06/21 0941   ? ? Subjective Some irritation in the area of the bandages lateral and medial Rt knee. Has an appointment with wound care today. Has been working on the exercises at home. Notices more movement in the knee   ? Currently in Pain? No/denies   ? Pain Score 0-No pain   ? ?  ?  ? ?  ? ? ? ? ? ? ? ? ? ? ? ? ? ? ? ? ? ? ? ? [facility] Adult PT Treatment/Exercise - 08/06/21 0001   ? ?  ? Knee/Hip Exercises: Stretches  ? Passive Hamstring Stretch Right;2 reps;30 seconds   ? Passive Hamstring Stretch Limitations supine with strap   ? ITB Stretch Right;2 reps;30 seconds   ? ITB Stretch Limitations supine with strap   ? Gastroc Stretch Right;2 reps;30 seconds   ? Gastroc Stretch Limitations incline board   ? Soleus Stretch Right;2 reps;30 seconds   ? Soleus Stretch Limitations incline board   ?  ? Knee/Hip Exercises: Aerobic  ? Nustep L5 x 7.5 min UE 10   ?  ? Knee/Hip Exercises: Standing  ? Hip ADduction Strengthening;Right;Left;20 reps   ? Hip ADduction Limitations leading with heel   ? Hip Extension Stengthening;Right;Left;Knee straight;20 reps   ? Wall Squat 10 reps;10 seconds   ? SLS 20 sec x  3 reps Rt LE   ?  ? Knee/Hip Exercises: Supine  ? Bridges Strengthening;Both;10 reps   5 sec hold  ? Straight Leg Raises AROM;Strengthening;Right;10 reps   ? Straight Leg Raises Limitations 5 sec hold 10 inch lift   ? ?  ?  ? ?  ? ? ? ? ? ? ? ? ? ? ? ? ? ? ? PT Long Term Goals - 07/28/21 1018   ? ?  ? PT LONG TERM GOAL #1  ? Title Increase AROM Rt knee to 0 degrees extension   ? Time 6   ? Period Weeks   ? Status New   ? Target Date 09/08/21   ?  ? PT LONG TERM GOAL #2  ? Title Improve functional strength with patient reporting return to full normal activites   ? Time 6   ? Period Weeks   ? Status New   ? Target Date 09/08/21   ?  ? PT LONG TERM GOAL #3  ? Title Independent in [place]   ? Time 6   ? Period Weeks   ? Status New   ? Target Date 09/08/21   ? ?  ?  ? ?  ? ? ? ? ? ? ? ?  Plan - 08/06/21 0943   ? ? Clinical Impression Statement Irritation at proximal borders of wound dressing with evidence of small blisters and reddened tissue. Patient to see wound care center for follow up today. Continued with exercise with no change in program until wound is assessed.   ? Rehab Potential Good   ? PT Frequency 2x / week   ? PT Duration 6 weeks   ? PT Treatment/Interventions ADLs/Self Care Home Management;Aquatic Therapy;Cryotherapy;Electrical Stimulation;Iontophoresis 4mg /ml Dexamethasone;Moist Heat;Ultrasound;Gait training;Stair training;Functional mobility training;Therapeutic activities;Therapeutic exercise;Balance training;Neuromuscular re-education;Patient/family education;Manual techniques;Passive range of motion;Dry needling;Taping;Vasopneumatic Device   ? PT Next Visit Plan review and progress HEP as indicated; continue with work to increase AROM Rt hip and knee, improve functional strength, increase functional activity level. Modalities as indicated   ? PT Home Exercise Plan [id number]   ? Consulted and Agree with Plan of Care Patient   ? ?  ?  ? ?  ? ? ?Patient will benefit from skilled therapeutic intervention in  order to improve the following deficits and impairments:    ? ?Visit Diagnosis: ?Stiffness of right knee, not elsewhere classified ? ?Other symptoms and signs involving the musculoskeletal system ? ? ? ? ?Problem List ?Patient Active Problem List  ? Diagnosis Date Noted  ? Crush injury, leg, lower, right, subsequent encounter 07/14/2021  ? Hematoma 07/14/2021  ? Effusion of right knee 07/14/2021  ? ? ?Everardo All, PT, MPH ?08/06/2021, 10:17 AM ? ?[place] ?[facility] ?[address] ?[place], [place], [address] ?Phone: [phone]   Fax:  [phone] ? ?Name: Laura Coleman ?MRN: [id number] ?Date of Birth: 1945/12/29 ? ? ? ?

## 2021-08-11 ENCOUNTER — Encounter: Payer: Self-pay | Admitting: Rehabilitative and Restorative Service Providers"

## 2021-08-11 ENCOUNTER — Ambulatory Visit: Payer: Medicare Other | Admitting: Rehabilitative and Restorative Service Providers"

## 2021-08-11 DIAGNOSIS — R29898 Other symptoms and signs involving the musculoskeletal system: Secondary | ICD-10-CM

## 2021-08-11 DIAGNOSIS — M25661 Stiffness of right knee, not elsewhere classified: Secondary | ICD-10-CM

## 2021-08-11 NOTE — Therapy (Signed)
[place] [facility] ?[address] [place] [address] ?[place], [place], [address] ?Phone: [phone]   Fax:  [phone] ? ?Physical Therapy Treatment ? ?Patient Details  ?Name: Laura Coleman ?MRN: [id number] ?Date of Birth: [birth year] ?Referring Provider (PT): Dr Binnie Rail ? ? ?Encounter Date: 08/11/2021 ? ? PT End of Session - 08/11/21 0933   ? ? Visit Number 5   ? Number of Visits 12   ? Date for PT Re-Evaluation 09/08/21   ? Progress Note Due on Visit 10   ? PT Start Time 0930   ? PT Stop Time 1014   ? PT Time Calculation (min) 44 min   ? Activity Tolerance Patient tolerated treatment well   ? ?  ?  ? ?  ? ? ?Past Medical History:  ?Diagnosis Date  ? Hypertension   ? ? ?History reviewed. No pertinent surgical history. ? ?There were no vitals filed for this visit. ? ? Subjective Assessment - 08/11/21 0933   ? ? Subjective Wound is healing. It was re-dressed at the wound center last week and she changed the bandage today but the bandage is not big enough. She will go by for more supplies today. Patient feels that her Rt LE is getting stronger and feeling better with functional activities.   ? Currently in Pain? No/denies   ? Pain Score 0-No pain   ? ?  ?  ? ?  ? ? ? ? ? [facility] PT Assessment - 08/11/21 0001   ? ?  ? Assessment  ? Medical Diagnosis Rt knee pain/edea/disfunction   ? Referring Provider (PT) Dr Binnie Rail   ? Onset Date/Surgical Date 07/09/21   ? Hand Dominance Right   ? Next MD Visit 08/24/21   ? Prior Therapy none   ?  ? Observation/Other Assessments  ? Skin Integrity wounds covered in bandage and secured - dressing dry and intact   ?  ? Posture/Postural Control  ? Posture Comments Rt hemipelvis rotated slightly posteriorly compared to Lt   ?  ? AROM  ? Right Knee Extension -2   ? Right Knee Flexion 135   ? Left Knee Extension 0   ? Left Knee Flexion 144   ? ?  ?  ? ?  ? ? ? ? ? ? ? ? ? ? ? ? ? ? ? ? [facility] Adult PT Treatment/Exercise - 08/11/21 0001   ? ?  ? Neuro  Re-ed   ? Neuro Re-ed Details  working on symmetrical hip position and equal wt bearing in standing - with and without mirror   ?  ? Knee/Hip Exercises: Stretches  ? Passive Hamstring Stretch Right;2 reps;30 seconds   ? Passive Hamstring Stretch Limitations supine with strap   ? Hip Flexor Stretch Right;3 reps;30 seconds   ? Hip Flexor Stretch Limitations supine thomas   ? Knee: Self-Stretch to increase Flexion Right;5 reps;10 seconds   ? Knee: Self-Stretch Limitations supine knee to chest; planting Rt foot and scooting buttocks toward heel   ? ITB Stretch Right;2 reps;30 seconds   ? ITB Stretch Limitations supine with strap   ? Gastroc Stretch Right;2 reps;30 seconds   ? Gastroc Stretch Limitations incline board   ? Soleus Stretch Right;2 reps;30 seconds   ? Soleus Stretch Limitations incline board   ?  ? Knee/Hip Exercises: Aerobic  ? Nustep L5 x 8 min UE 10   ?  ? Knee/Hip Exercises: Standing  ? SLS 20 sec x 5 reps Rt/  3 reps Lt  LE's   ? Other Standing Knee Exercises marching x 20 each side   ?  ? Knee/Hip Exercises: Seated  ? Sit to Sand 15 reps;without UE support   last 10 with Rt foot back behind Lt  ?  ? Knee/Hip Exercises: Supine  ? Bridges Strengthening;Both;10 reps   5 sec hold  ? Straight Leg Raises Strengthening;Right;10 reps   ? Straight Leg Raises Limitations quad set - 5 sec hold 10 inch lift   ? ?  ?  ? ?  ? ? ? ? ? ? ? ? ? ? ? ? ? ? ? PT Long Term Goals - 07/28/21 1018   ? ?  ? PT LONG TERM GOAL #1  ? Title Increase AROM Rt knee to 0 degrees extension   ? Time 6   ? Period Weeks   ? Status New   ? Target Date 09/08/21   ?  ? PT LONG TERM GOAL #2  ? Title Improve functional strength with patient reporting return to full normal activites   ? Time 6   ? Period Weeks   ? Status New   ? Target Date 09/08/21   ?  ? PT LONG TERM GOAL #3  ? Title Independent in HEP   ? Time 6   ? Period Weeks   ? Status New   ? Target Date 09/08/21   ? ?  ?  ? ?  ? ? ? ? ? ? ? ? Plan - 08/11/21 0934   ? ? Clinical  Impression Statement Continued wound healing. Rt knee ROM and LE strength continue to improve. Patient is returning to more normal functional activities. Progressing well toward stated goals of therapy.   ? Rehab Potential Good   ? PT Frequency 2x / week   ? PT Duration 6 weeks   ? PT Treatment/Interventions ADLs/Self Care Home Management;Aquatic Therapy;Cryotherapy;Electrical Stimulation;Iontophoresis 4mg /ml Dexamethasone;Moist Heat;Ultrasound;Gait training;Stair training;Functional mobility training;Therapeutic activities;Therapeutic exercise;Balance training;Neuromuscular re-education;Patient/family education;Manual techniques;Passive range of motion;Dry needling;Taping;Vasopneumatic Device   ? PT Next Visit Plan review and progress HEP as indicated; continue with work to increase AROM Rt hip and knee, improve functional strength, increase functional activity level. Modalities as indicated   ? PT Home Exercise Plan [email or web address]   ? Consulted and Agree with Plan of Care Patient   ? ?  ?  ? ?  ? ? ?Patient will benefit from skilled therapeutic intervention in order to improve the following deficits and impairments:    ? ?Visit Diagnosis: ?Stiffness of right knee, not elsewhere classified ? ?Other symptoms and signs involving the musculoskeletal system ? ? ? ? ?Problem List ?Patient Active Problem List  ? Diagnosis Date Noted  ? Crush injury, leg, lower, right, subsequent encounter 07/14/2021  ? Hematoma 07/14/2021  ? Effusion of right knee 07/14/2021  ? ? ?09/13/2021, PT, MPH ?08/11/2021, 10:13 AM ? ?[place] ?[facility] ?[address] ?[place], [place], [place] ?Phone: [phone]   Fax:  [phone] ? ?Name: Laura Coleman ?MRN: [name] ?Date of Birth: Nov 24, 1945 ? ? ? ?

## 2021-08-13 ENCOUNTER — Encounter: Payer: Self-pay | Admitting: Rehabilitative and Restorative Service Providers"

## 2021-08-13 ENCOUNTER — Ambulatory Visit: Payer: Medicare Other | Admitting: Rehabilitative and Restorative Service Providers"

## 2021-08-13 DIAGNOSIS — R29898 Other symptoms and signs involving the musculoskeletal system: Secondary | ICD-10-CM

## 2021-08-13 DIAGNOSIS — M25661 Stiffness of right knee, not elsewhere classified: Secondary | ICD-10-CM

## 2021-08-13 NOTE — Therapy (Signed)
[place] ?[facility] ?[address] ?[place], [place], [address] ?Phone: [phone]   Fax:  [phone] ? ?Physical Therapy Treatment ? ?Patient Details  ?Name: Laura Coleman ?MRN: [id number] ?Date of Birth: October 19, 1945 ?Referring Provider (PT): Dr Binnie Rail ? ? ?Encounter Date: 08/13/2021 ? ? PT End of Session - 08/13/21 4742   ? ? Visit Number 6   ? Number of Visits 12   ? Date for PT Re-Evaluation 09/08/21   ? Progress Note Due on Visit 10   ? PT Start Time [phone]   ? PT Stop Time 1015   ? PT Time Calculation (min) 47 min   ? Activity Tolerance Patient tolerated treatment well   ? ?  ?  ? ?  ? ? ?Past Medical History:  ?Diagnosis Date  ? Hypertension   ? ? ?History reviewed. No pertinent surgical history. ? ?There were no vitals filed for this visit. ? ? Subjective Assessment - 08/13/21 0939   ? ? Subjective Another wound care appointment next week. She is doing more at home but not as much with her exercises as maybe she should. Can feel fatigue in Rt LE with exercises. Notices some tingling in the Rt thigh and calf at times.   ? Currently in Pain? No/denies   ? Pain Score 0-No pain   ? Pain Location Knee   ? Pain Orientation Right   ? ?  ?  ? ?  ? ? ? ? ? [facility] PT Assessment - 08/13/21 0001   ? ?  ? Assessment  ? Medical Diagnosis Rt knee pain/edema/disfunction   ? Referring Provider (PT) Dr Binnie Rail   ? Onset Date/Surgical Date 07/09/21   ? Hand Dominance Right   ? Next MD Visit 08/24/21   ? Prior Therapy none   ?  ? Observation/Other Assessments  ? Skin Integrity wounds covered in bandage and secured - dressing dry and intact   ?  ? AROM  ? Right Knee Extension -2   ? Right Knee Flexion 130   ? Left Knee Extension 0   ? Left Knee Flexion 144   ?  ? Strength  ? Right/Left Hip --   WFL's bilat  ? Right Knee Flexion --   5-/5  ? Right Knee Extension 5/5   ? Left Knee Flexion 5/5   ? Left Knee Extension 5/5   ?  ? Ambulation/Gait  ? Gait Comments gait pattern  returning to normal with no limp Rt LE   ? ?  ?  ? ?  ? ? ? ? ? ? ? ? ? ? ? ? ? ? ? ? [facility] Adult PT Treatment/Exercise - 08/13/21 0001   ? ?  ? Knee/Hip Exercises: Stretches  ? Active Hamstring Stretch Right;3 reps;30 seconds   ? Active Hamstring Stretch Limitations sitting - overpressure distal thigh to iprove knee extension   ? Gastroc Stretch Right;2 reps;30 seconds   ? Gastroc Stretch Limitations incline board   ? Soleus Stretch Right;2 reps;30 seconds   ? Soleus Stretch Limitations incline board   ?  ? Knee/Hip Exercises: Aerobic  ? Nustep L6 x 8 min UE 10   ?  ? Knee/Hip Exercises: Standing  ? Lateral Step Up Right;20 reps;Step Height: 2";Step Height: 6"   ? Forward Step Up Right;20 reps;Hand Hold: 2;Step Height: 6"   ? Functional Squat 20 reps   ? SLS 20 sec x 3 reps Rt/ 3 reps Lt  LE's   ?  SLS with Vectors SLS reaching forward to touch chair seat x 20 reps x 2 sets Rt/1 Lt   ? Other Standing Knee Exercises side steps blue TB above knees x 40 ft x 2 reps each side; monster steps bilat x 4 fwd/back   ? Other Standing Knee Exercises toe tap fwd/side/back each side x 10 reps - minimal UE support for balance as needed   ?  ? Knee/Hip Exercises: Supine  ? Bridges Strengthening;Both;10 reps   5 sec hold  ? Straight Leg Raises Strengthening;Right;10 reps   ? Straight Leg Raises Limitations quad set - 5 sec hold 10 inch lift   ? ?  ?  ? ?  ? ? ? ? ? ? ? ? ? ? PT Education - 08/13/21 1023   ? ? Education Details HEP   ? Person(s) Educated Patient   ? Methods Explanation;Demonstration;Tactile cues;Verbal cues;Handout   ? Comprehension Verbalized understanding;Returned demonstration;Verbal cues required;Tactile cues required   ? ?  ?  ? ?  ? ? ? ? ? ? PT Long Term Goals - 07/28/21 1018   ? ?  ? PT LONG TERM GOAL #1  ? Title Increase AROM Rt knee to 0 degrees extension   ? Time 6   ? Period Weeks   ? Status New   ? Target Date 09/08/21   ?  ? PT LONG TERM GOAL #2  ? Title Improve functional strength with patient  reporting return to full normal activites   ? Time 6   ? Period Weeks   ? Status New   ? Target Date 09/08/21   ?  ? PT LONG TERM GOAL #3  ? Title Independent in HEP   ? Time 6   ? Period Weeks   ? Status New   ? Target Date 09/08/21   ? ?  ?  ? ?  ? ? ? ? ? ? ? ? Plan - 08/13/21 0940   ? ? Clinical Impression Statement Progressing well with Rt knee rehab with improving ROM and strength. Gait has returned to normal with no assistive device.   ? Rehab Potential Good   ? PT Frequency 2x / week   ? PT Duration 6 weeks   ? PT Treatment/Interventions ADLs/Self Care Home Management;Aquatic Therapy;Cryotherapy;Electrical Stimulation;Iontophoresis 4mg /ml Dexamethasone;Moist Heat;Ultrasound;Gait training;Stair training;Functional mobility training;Therapeutic activities;Therapeutic exercise;Balance training;Neuromuscular re-education;Patient/family education;Manual techniques;Passive range of motion;Dry needling;Taping;Vasopneumatic Device   ? PT Next Visit Plan review and progress HEP as indicated; continue with work to increase AROM Rt hip and knee, improve functional strength, increase functional activity level   ? PT Home Exercise Plan [email or web address]   ? Consulted and Agree with Plan of Care Patient   ? ?  ?  ? ?  ? ? ?Patient will benefit from skilled therapeutic intervention in order to improve the following deficits and impairments:    ? ?Visit Diagnosis: ?Stiffness of right knee, not elsewhere classified ? ?Other symptoms and signs involving the musculoskeletal system ? ? ? ? ?Problem List ?Patient Active Problem List  ? Diagnosis Date Noted  ? Crush injury, leg, lower, right, subsequent encounter 07/14/2021  ? Hematoma 07/14/2021  ? Effusion of right knee 07/14/2021  ? ? ?09/13/2021, PT, MPH  ?08/13/2021, 10:23 AM ? ?[place] ?[facility] ?[address] ?[place], [place], [place] ?Phone: [phone]   Fax:  [phone] ? ?Name: Laura Coleman ?MRN: [name] ?Date of Birth:  Aug 31, 1945 ? ? ? ?

## 2021-08-13 NOTE — Patient Instructions (Signed)
Access Code: [email or web address] ?URL: [email or web address] ?Date: 08/13/2021 ?Prepared by: Corlis Leak ? ?Exercises ?- Hooklying Hamstring Stretch with Strap  - 2 x daily - 7 x weekly - 1 sets - 3 reps - 30 sec  hold ?- Supine ITB Stretch with Strap  - 2 x daily - 7 x weekly - 1 sets - 3 reps - 30 sec  hold ?- Supine Piriformis Stretch with Leg Straight  - 2 x daily - 7 x weekly - 1 sets - 3 reps - 30 sec  hold ?- Hip Flexor Stretch at Edge of Bed  - 2 x daily - 7 x weekly - 1 sets - 3 reps - 30 sec  hold ?- Hooklying Single Knee to Chest Stretch  - 2 x daily - 7 x weekly - 1 sets - 3-5 reps - 10-15 sec  hold ?- Supine Quad Set  - 2 x daily - 7 x weekly - 1 sets - 10 reps - 3 sec  hold ?- Small Range Straight Leg Raise  - 2 x daily - 7 x weekly - 1 sets - 10 reps - 5 sec  hold ?- Ankle Pumps in Elevation  - 2 x daily - 7 x weekly - 1 sets - 10-20 reps ?- Ankle Circles in Elevation  - 2 x daily - 7 x weekly - 1 sets - 10-15 reps ?- Ankle Alphabet in Elevation  - 2 x daily - 7 x weekly - 1 sets - 3 reps ?- Supine Quad Set  - 2 x daily - 7 x weekly - 1 sets - 10 reps - 3 sec  hold ?- Seated Hip Flexor Stretch  - 2 x daily - 7 x weekly - 1 sets - 3 reps - 30 sec  hold ?- Gastroc Stretch on Wall  - 2 x daily - 7 x weekly - 1 sets - 3 reps - 30 sec  hold ?- Soleus Stretch on Wall  - 2 x daily - 7 x weekly - 1 sets - 3 reps - 30 sec  hold ?- Single Leg Stance with Support  - 2 x daily - 7 x weekly - 1 sets - 5 reps - 15-20 sec  hold ?- Mini Squat with Counter Support  - 2 x daily - 7 x weekly - 1-2 sets - 10 reps - 3 sec  hold ?- Standing Hip Extension with Counter Support  - 2 x daily - 7 x weekly - 1-2 sets - 10 reps - 3-5 sec  hold ?- Standing Hip Abduction with Counter Support  - 2 x daily - 7 x weekly - 2-3 sets - 10 reps - 2-3 sec  hold ?- Side Stepping with Resistance at Thighs  - 1 x daily - 7 x weekly ?

## 2021-08-18 ENCOUNTER — Ambulatory Visit: Payer: Medicare Other | Admitting: Rehabilitative and Restorative Service Providers"

## 2021-08-20 ENCOUNTER — Encounter: Payer: Medicare Other | Admitting: Rehabilitative and Restorative Service Providers"

## 2021-08-24 ENCOUNTER — Ambulatory Visit: Payer: Medicare Other | Admitting: Family Medicine

## 2021-08-24 ENCOUNTER — Encounter: Payer: Self-pay | Admitting: Family Medicine

## 2021-08-24 DIAGNOSIS — T148XXA Other injury of unspecified body region, initial encounter: Secondary | ICD-10-CM

## 2021-08-24 NOTE — Progress Notes (Signed)
  Laura Coleman - 76 y.o. female MRN [id number]  Date of birth: 1946/02/03  SUBJECTIVE:  Including CC & ROS.  No chief complaint on file.   Laura Coleman is a 76 y.o. female that is following up for her right thigh injury.  She has been doing well with physical therapy.  Her wounds are healing nicely with wound care.  She is almost back to normal..   Review of Systems See HPI   HISTORY: Past Medical, Surgical, Social, and Family History Reviewed & Updated per EMR.   Pertinent Historical Findings include:  Past Medical History:  Diagnosis Date   Hypertension     History reviewed. No pertinent surgical history.   PHYSICAL EXAM:  VS: BP 118/82 (BP Location: Left Arm, Patient Position: Sitting)   Ht 5\' 4"  (1.626 m)   Wt 130 lb (59 kg)   LMP 02/19/2020   BMI 22.31 kg/m  Physical Exam Gen: NAD, alert, cooperative with exam, well-appearing MSK:  Neurovascularly intact    ECSWT Note Laura Coleman Jun 24, 1945  Procedure: ECSWT Indications: right leg pain   Procedure Details Consent: Risks of procedure as well as the alternatives and risks of each were explained to the (patient/caregiver).  Consent for procedure obtained. Time Out: Verified patient identification, verified procedure, site/side was marked, verified correct patient position, special equipment/implants available, medications/allergies/relevent history reviewed, required imaging and test results available.  Performed.  The area was cleaned with iodine and alcohol swabs.    The right thigh was targeted for Extracorporeal shockwave therapy.   Preset: Muscle injury Power Level: 50 Frequency: 10 Impulse/cycles: 2200 Head size: Large Session: 1  Patient did tolerate procedure well.    ASSESSMENT & PLAN:   Hematoma Has significantly improved.  Her function is almost back to normal. -Counseled on home exercise therapy and supportive care. -Can continue physical therapy. -Perform shockwave therapy.

## 2021-08-24 NOTE — Assessment & Plan Note (Signed)
Has significantly improved.  Her function is almost back to normal. -Counseled on home exercise therapy and supportive care. -Can continue physical therapy. -Perform shockwave therapy.

## 2021-08-25 ENCOUNTER — Encounter: Payer: Self-pay | Admitting: Rehabilitative and Restorative Service Providers"

## 2021-08-25 ENCOUNTER — Ambulatory Visit: Payer: Medicare Other | Admitting: Rehabilitative and Restorative Service Providers"

## 2021-08-25 DIAGNOSIS — M25661 Stiffness of right knee, not elsewhere classified: Secondary | ICD-10-CM

## 2021-08-25 DIAGNOSIS — R29898 Other symptoms and signs involving the musculoskeletal system: Secondary | ICD-10-CM

## 2021-08-25 NOTE — Therapy (Signed)
[facility] [place], [place], [address] Phone: [phone]   Fax:  [phone]  Physical Therapy Treatment Rationale for Evaluation and Treatment Rehabilitation  Patient Details  Name: Laura Coleman MRN: [id number] Date of Birth: Oct 22, 1945 Referring Provider (PT): Dr Cherly Hensen   Encounter Date: 08/25/2021   PT End of Session - 08/25/21 0945     Visit Number 7    Number of Visits 12    Date for PT Re-Evaluation 09/08/21    PT Start Time 0930    PT Stop Time 1015    PT Time Calculation (min) 45 min    Activity Tolerance Patient tolerated treatment well             Past Medical History:  Diagnosis Date   Hypertension     History reviewed. No pertinent surgical history.  There were no vitals filed for this visit.   Subjective Assessment - 08/25/21 0949     Subjective Patient reports that the wounds are healing gradually. She has no pain but feels the fluid is not moving around her knee at times. Feels stronger and has returned to most normal activities at home    Currently in Pain? No/denies    Pain Score 0-No pain    Pain Location Knee    Pain Orientation Right    Pain Type Acute pain                [facility] PT Assessment - 08/25/21 0001       Assessment   Medical Diagnosis Rt knee pain/edema/disfunction    Referring Provider (PT) Dr Cherly Hensen    Onset Date/Surgical Date 07/09/21    Hand Dominance Right    Next MD Visit 08/24/21    Prior Therapy none      Observation/Other Assessments   Skin Integrity wounds covered in bandage and secured - dressing dry and intact      AROM   Right Knee Extension 0    Right Knee Flexion 135    Left Knee Extension 0    Left Knee Flexion 144      Strength   Right/Left Hip --   WFL's bilat   Right Knee Flexion 5/5    Right Knee Extension 5/5    Left Knee Flexion 5/5    Left Knee Extension 5/5                            [facility] Adult PT Treatment/Exercise - 08/25/21 0001       Self-Care   Lifting deadlift 20# KB from 8 inch stool x 5; from floor x 15 reps      Knee/Hip Exercises: Stretches   Active Hamstring Stretch Right;3 reps;30 seconds    Active Hamstring Stretch Limitations sitting - overpressure distal thigh to iprove knee extension    Passive Hamstring Stretch Right;2 reps;30 seconds    Passive Hamstring Stretch Limitations supine with strap    Knee: Self-Stretch to increase Flexion Right;5 reps;10 seconds    Knee: Self-Stretch Limitations supine knee to chest; planting Rt foot and scooting buttocks toward heel    Gastroc Stretch Right;2 reps;30 seconds    Gastroc Stretch Limitations incline board    Soleus Stretch Right;2 reps;30 seconds    Soleus Stretch Limitations incline board      Knee/Hip Exercises: Aerobic   Nustep L6 x 6 min UE 10      Manual Therapy  Joint Mobilization overpressure into knee extension 10 sec hold x 5 reps    Myofascial Release working through the lateral proximal calf in area of bruising    [place] create space and improve circulation at area of bruising lateral proximal calf                          PT Long Term Goals - 07/28/21 1018       PT LONG TERM GOAL #1   Title Increase AROM Rt knee to 0 degrees extension    Time 6    Period Weeks    Status New    Target Date 09/08/21      PT LONG TERM GOAL #2   Title Improve functional strength with patient reporting return to full normal activites    Time 6    Period Weeks    Status New    Target Date 09/08/21      PT LONG TERM GOAL #3   Title Independent in HEP    Time 6    Period Weeks    Status New    Target Date 09/08/21                   Plan - 08/25/21 1023     Clinical Impression Statement Good progress with Rt knee. Patient has some continued end range tightness with Rt knee flexion  and extension as well as muscular/fascial tightness proximal lateral calf. Strength is WFL's. Patient will continue to work on HEP focus on end range ROM and work on myofacial release and STM to area of tightness below bandage. Trial of kinesotape for area of tightness.    Rehab Potential Good    PT Frequency 2x / week    PT Duration 6 weeks    PT Treatment/Interventions ADLs/Self Care Home Management;Aquatic Therapy;Cryotherapy;Electrical Stimulation;Iontophoresis 4mg /ml Dexamethasone;Moist Heat;Ultrasound;Gait training;Stair training;Functional mobility training;Therapeutic activities;Therapeutic exercise;Balance training;Neuromuscular re-education;Patient/family education;Manual techniques;Passive range of motion;Dry needling;Taping;Vasopneumatic Device    PT Next Visit Plan review and progress HEP as indicated; continue with work to increase AROM Rt hip and knee, improve functional strength, increase functional activity level assess response to kinesotape    PT Home Exercise Plan [email or web address]    Consulted and Agree with Plan of Care Patient             Patient will benefit from skilled therapeutic intervention in order to improve the following deficits and impairments:     Visit Diagnosis: Stiffness of right knee, not elsewhere classified  Other symptoms and signs involving the musculoskeletal system     Problem List Patient Active Problem List   Diagnosis Date Noted   Crush injury, leg, lower, right, subsequent encounter 07/14/2021   Hematoma 07/14/2021   Effusion of right knee 07/14/2021    Laura Coleman, PT, MPH  08/25/2021, 10:27 AM  [facility] [place] [place], [place], [address] Phone: [phone]   Fax:  [phone]  Name: Laura Coleman MRN: [id number] Date of Birth: [birth year]

## 2021-08-27 ENCOUNTER — Ambulatory Visit: Payer: Medicare Other | Admitting: Rehabilitative and Restorative Service Providers"

## 2021-09-01 ENCOUNTER — Encounter: Payer: Self-pay | Admitting: Rehabilitative and Restorative Service Providers"

## 2021-09-01 ENCOUNTER — Ambulatory Visit: Payer: Medicare Other | Admitting: Rehabilitative and Restorative Service Providers"

## 2021-09-01 DIAGNOSIS — R29898 Other symptoms and signs involving the musculoskeletal system: Secondary | ICD-10-CM

## 2021-09-01 DIAGNOSIS — M25661 Stiffness of right knee, not elsewhere classified: Secondary | ICD-10-CM | POA: Diagnosis not present

## 2021-09-01 NOTE — Therapy (Addendum)
[facility] [place], [place], [address] Phone: [phone]   Fax:  [phone]  Physical Therapy Treatment Rationale for Evaluation and Treatment Rehabilitation  PHYSICAL THERAPY DISCHARGE SUMMARY  Visits from Start of Care: 8  Current functional level related to goals / functional outcomes: See progress note for discharge status    Remaining deficits: No known deficits - needs to continue HEP    Education / Equipment: HEP   Patient agrees to discharge. Patient goals were met. Patient is being discharged due to meeting the stated rehab goals.  Lesli Issa P. Helene Kelp PT, MPH 09/30/21 4:22 PM   Patient Details  Name: Laura Coleman MRN: [id number] Date of Birth: 06-06-1945 Referring Provider (PT): Dr Cherly Hensen   Encounter Date: 09/01/2021   PT End of Session - 09/01/21 1059     Visit Number 8    Number of Visits 12    Date for PT Re-Evaluation 09/08/21    PT Start Time 3546    PT Stop Time 1144    PT Time Calculation (min) 46 min    Activity Tolerance Patient tolerated treatment well             Past Medical History:  Diagnosis Date   Hypertension     History reviewed. No pertinent surgical history.  There were no vitals filed for this visit.   Subjective Assessment - 09/01/21 1100     Subjective She is doing some exercises but not all the leg exercises. Wounds are healing well now. Can't tell if the kinesotape is helping the area of tightness lateral calf.    Currently in Pain? No/denies    Pain Score 0-No pain    Pain Location Knee    Pain Orientation Right    Pain Type Acute pain                [facility] PT Assessment - 09/01/21 0001       Assessment   Medical Diagnosis Rt knee pain/edema/disfunction    Referring Provider (PT) Dr Cherly Hensen    Onset Date/Surgical Date 07/09/21    Hand Dominance Right    Next MD Visit 08/24/21    Prior Therapy none       Observation/Other Assessments   Skin Integrity lateral wound no longer covered with bandage small amount of eschar noted but wound is healing well; medial wound covered in bandage and secured - dressing dry and intact      AROM   Right Knee Extension 0    Right Knee Flexion 138    Left Knee Extension 0    Left Knee Flexion 144      Strength   Right/Left Hip --   WFL's bilat LE's   Right Knee Flexion 5/5    Right Knee Extension 5/5    Left Knee Flexion 5/5    Left Knee Extension 5/5      Ambulation/Gait   Gait Comments normal gait pattern                           [facility] Adult PT Treatment/Exercise - 09/01/21 0001       Knee/Hip Exercises: Stretches   Gastroc Stretch Right;2 reps;30 seconds    Gastroc Stretch Limitations incline board    Soleus Stretch Right;2 reps;30 seconds    Soleus Stretch Limitations incline board      Knee/Hip Exercises: Aerobic   Nustep L6 x 10 min UE  10      Knee/Hip Exercises: Standing   Wall Squat 10 reps;10 seconds;2 sets    SLS 10 reps 3 sec hold; 10 reps 3 sec hold Rt LE only    SLS with Vectors SLS reaching forward to touch chair seat x 10 reps x 2 sets Rt/1 Lt    Other Standing Knee Exercises side steps blue TB above knees x 2ft x 4 reps each side; monster steps bilat x20 ft x 4 each side      Knee/Hip Exercises: Supine   Bridges Strengthening;Both;2 sets;10 reps   5 sec hold   Bridges Limitations LE's on orange swiss ball    Single Leg Bridge Strengthening;Right;10 reps   5 sec hold   Other Supine Knee/Hip Exercises hip flexion with orange ball to shoulder flexion and reverse x 10 reps core tight                     PT Education - 09/01/21 1144     Education Details HEP    Person(s) Educated Patient    Methods Explanation;Demonstration;Tactile cues;Verbal cues;Handout    Comprehension Verbalized understanding;Returned demonstration;Verbal cues required;Tactile cues required                 PT  Long Term Goals - 07/28/21 1018       PT LONG TERM GOAL #1   Title Increase AROM Rt knee to 0 degrees extension    Time 6    Period Weeks    Status New    Target Date 09/08/21      PT LONG TERM GOAL #2   Title Improve functional strength with patient reporting return to full normal activites    Time 6    Period Weeks    Status New    Target Date 09/08/21      PT LONG TERM GOAL #3   Title Independent in HEP    Time 6    Period Weeks    Status New    Target Date 09/08/21                   Plan - 09/01/21 1105     Clinical Impression Statement Paitent has returned to normal functional activites. She has minimal pain in the Rt knee on an intermittent and brief basis. Patient demonstrates good strength and ROM. Will return one additional appointment for final eval prior to MD appt.    Rehab Potential Good    PT Frequency 2x / week    PT Duration 6 weeks    PT Treatment/Interventions ADLs/Self Care Home Management;Aquatic Therapy;Cryotherapy;Electrical Stimulation;Iontophoresis 4mg /ml Dexamethasone;Moist Heat;Ultrasound;Gait training;Stair training;Functional mobility training;Therapeutic activities;Therapeutic exercise;Balance training;Neuromuscular re-education;Patient/family education;Manual techniques;Passive range of motion;Dry needling;Taping;Vasopneumatic Device    PT Next Visit Plan review and progress HEP as indicated; continue with work to increase AROM Rt hip and knee, improve functional strength, increase functional activity level    PT Home Exercise Plan [email or web address]    Consulted and Agree with Plan of Care Patient             Patient will benefit from skilled therapeutic intervention in order to improve the following deficits and impairments:     Visit Diagnosis: Stiffness of right knee, not elsewhere classified  Other symptoms and signs involving the musculoskeletal system     Problem List Patient Active Problem List   Diagnosis Date Noted   Crush  injury, leg, lower, right, subsequent encounter 07/14/2021   Hematoma 07/14/2021  Effusion of right knee 07/14/2021    Kewon Statler Nilda Simmer, PT, MPH  09/01/2021, 11:47 AM  [facility] [place], [place], [address] Phone: [phone]   Fax:  [phone]  Name: Marissah Vandemark MRN: [id number] Date of Birth: 1945/06/22

## 2021-09-01 NOTE — Patient Instructions (Signed)
Access Code: [email or web address] URL: [email or web address] Date: 09/01/2021 Prepared by: Corlis Leak  Exercises - Hooklying Hamstring Stretch with Strap  - 2 x daily - 7 x weekly - 1 sets - 3 reps - 30 sec  hold - Supine ITB Stretch with Strap  - 2 x daily - 7 x weekly - 1 sets - 3 reps - 30 sec  hold - Supine Piriformis Stretch with Leg Straight  - 2 x daily - 7 x weekly - 1 sets - 3 reps - 30 sec  hold - Hip Flexor Stretch at Edge of Bed  - 2 x daily - 7 x weekly - 1 sets - 3 reps - 30 sec  hold - Hooklying Single Knee to Chest Stretch  - 2 x daily - 7 x weekly - 1 sets - 3-5 reps - 10-15 sec  hold - Supine Quad Set  - 2 x daily - 7 x weekly - 1 sets - 10 reps - 3 sec  hold - Small Range Straight Leg Raise  - 2 x daily - 7 x weekly - 1 sets - 10 reps - 5 sec  hold - Ankle Pumps in Elevation  - 2 x daily - 7 x weekly - 1 sets - 10-20 reps - Ankle Circles in Elevation  - 2 x daily - 7 x weekly - 1 sets - 10-15 reps - Ankle Alphabet in Elevation  - 2 x daily - 7 x weekly - 1 sets - 3 reps - Supine Quad Set  - 2 x daily - 7 x weekly - 1 sets - 10 reps - 3 sec  hold - Seated Hip Flexor Stretch  - 2 x daily - 7 x weekly - 1 sets - 3 reps - 30 sec  hold - Gastroc Stretch on Wall  - 2 x daily - 7 x weekly - 1 sets - 3 reps - 30 sec  hold - Soleus Stretch on Wall  - 2 x daily - 7 x weekly - 1 sets - 3 reps - 30 sec  hold - Single Leg Stance with Support  - 2 x daily - 7 x weekly - 1 sets - 5 reps - 15-20 sec  hold - Mini Squat with Counter Support  - 2 x daily - 7 x weekly - 1-2 sets - 10 reps - 3 sec  hold - Standing Hip Extension with Counter Support  - 2 x daily - 7 x weekly - 1-2 sets - 10 reps - 3-5 sec  hold - Standing Hip Abduction with Counter Support  - 2 x daily - 7 x weekly - 2-3 sets - 10 reps - 2-3 sec  hold - Side Stepping with Resistance at Thighs  - 1 x daily - 7 x weekly - Standing Heel Raise with Support  - 1 x daily - 7 x weekly - 1 sets - 10 reps - 1 sec  hold - Standing  Single Leg Heel Raise  - 1 x daily - 7 x weekly - 2-3 sets - 10 reps - 1 sec  hold

## 2021-09-15 ENCOUNTER — Encounter: Payer: Medicare Other | Admitting: Rehabilitative and Restorative Service Providers"

## 2021-10-09 ENCOUNTER — Emergency Department
Admission: RE | Admit: 2021-10-09 | Discharge: 2021-10-09 | Disposition: A | Discharge status: Home / Self Care | Payer: Medicare Other | Source: Ambulatory Visit | Attending: Family Medicine | Admitting: Family Medicine

## 2021-10-09 VITALS — BP 194/81 | HR 64 | Temp 98.1°F | Resp 17

## 2021-10-09 DIAGNOSIS — J069 Acute upper respiratory infection, unspecified: Secondary | ICD-10-CM | POA: Diagnosis not present

## 2021-10-09 MED ORDER — AZITHROMYCIN 250 MG PO TABS: ORAL_TABLET | ORAL | 0 refills | Status: DC

## 2021-10-09 NOTE — ED Triage Notes (Signed)
Pt c/o cough x 2 days. Also fatigued. Denies fever. Ibuprofen prn. Pt hypertensive today, says she takes her BP meds at night. Sometimes takes it BID.

## 2021-10-09 NOTE — ED Provider Notes (Signed)
[name]    CSN: [id number] Arrival date & time: 10/09/21  1546      History   Chief Complaint Chief Complaint  Patient presents with   Cough    HPI Laura Coleman is a 76 y.o. female.   Patient states that she had been with her young grandson three days ago who had a cold.  Two days ago she developed a mild cough, fatigue, and mild sore throat.  She denies nasal congestion and fever.  She denies pleuritic pain and shortness of breath.  The history is provided by the patient.    Past Medical History:  Diagnosis Date   Hypertension     Patient Active Problem List   Diagnosis Date Noted   Crush injury, leg, lower, right, subsequent encounter 07/14/2021   Hematoma 07/14/2021   Effusion of right knee 07/14/2021    History reviewed. No pertinent surgical history.  OB History   No obstetric history on file.      Home Medications    Prior to Admission medications   Medication Sig Start Date End Date Taking? Authorizing Provider  azithromycin (ZITHROMAX Z-PAK) 250 MG tablet Take 2 tabs today; then begin one tab once daily for 4 more days 10/09/21  Yes Lajuanda Penick, Tera Mater, MD  losartan (COZAAR) 25 MG tablet Take 25 mg by mouth daily. 04/30/21   [provider]  mupirocin ointment (BACTROBAN) 2 % Apply 1 application. topically 2 (two) times daily. 07/09/21   Eustace Moore, MD    Family History Family History  Problem Relation Age of Onset   Cancer Mother    Osteoporosis Mother    Pneumonia Father     Social History Social History   Tobacco Use   Smoking status: Never   Smokeless tobacco: Never  Vaping Use   Vaping Use: Never used  Substance Use Topics   Alcohol use: Not Currently   Drug use: Not Currently     Allergies   Patient has no known allergies.   Review of Systems Review of Systems + sore throat + cough No pleuritic pain No wheezing No nasal congestion No post-nasal drainage No sinus pain/pressure No itchy/red  eyes No earache No hemoptysis No SOB No fever/chills No nausea No vomiting No abdominal pain No diarrhea No urinary symptoms No skin rash + fatigue No myalgias No headache Used OTC meds (Ibuprofen) without relief   Physical Exam Triage Vital Signs ED Triage Vitals  Enc Vitals Group     BP 10/09/21 1556 (!) 194/81     Pulse Rate 10/09/21 1556 64     Resp 10/09/21 1556 17     Temp 10/09/21 1556 98.1 F (36.7 C)     Temp Source 10/09/21 1556 Oral     SpO2 10/09/21 1556 99 %     Weight --      Height --      Head Circumference --      Peak Flow --      Pain Score 10/09/21 1555 0     Pain Loc --      Pain Edu? --      Excl. in GC? --    No data found.  Updated Vital Signs BP (!) 194/81 (BP Location: Left Arm)   Pulse 64   Temp 98.1 F (36.7 C) (Oral)   Resp 17   LMP 02/19/2020   SpO2 99%   Visual Acuity Right Eye Distance:   Left Eye Distance:   Bilateral Distance:  Right Eye Near:   Left Eye Near:    Bilateral Near:     Physical Exam Nursing notes and Vital Signs reviewed. Appearance:  Patient appears stated age, and in no acute distress Eyes:  Pupils are equal, round, and reactive to light and accomodation.  Extraocular movement is intact.  Conjunctivae are not inflamed  Ears:  Canals normal.  Tympanic membranes normal.  Nose:  Mildly congested turbinates.  No sinus tenderness.    Pharynx:  Normal Neck:  Supple.  Mildly enlarged lateral nodes are present, tender to palpation on the left.   Lungs:  Clear to auscultation.  Breath sounds are equal.  Moving air well. Heart:  Regular rate and rhythm without murmurs, rubs, or gallops.  Abdomen:  Nontender without masses or hepatosplenomegaly.  Bowel sounds are present.  No CVA or flank tenderness.  Extremities:  No edema.  Skin:  No rash present.   UC Treatments / Results  Labs (all labs ordered are listed, but only abnormal results are displayed) Labs Reviewed  SARS-COV-2 RNA,(COVID-19) QUALITATIVE  NAAT    EKG   Radiology No results found.  Procedures Procedures (including critical care time)  Medications Ordered in UC Medications - No data to display  Initial Impression / Assessment and Plan / UC Course  I have reviewed the triage vital signs and the nursing notes.  Pertinent labs & imaging results that were available during my care of the patient were reviewed by me and considered in my medical decision making (see chart for details).    As a precaution, will check COVID PCR. Benign exam.  There is no evidence of bacterial infection today.  Treat symptomatically for now.  Will give Rx for Z-pack to hold (begin if not improving about one week or if persistent fever develops).  Followup with Family Doctor if not improved in about 10 days.  Final Clinical Impressions(s) / UC Diagnoses   Final diagnoses:  Viral URI with cough     Discharge Instructions      Take plain guaifenesin (1200mg  extended release tabs such as Mucinex) twice daily, with plenty of water, for cough and congestion. Get adequate rest.   May take Delsym Cough Suppressant ("12 Hour Cough Relief") at bedtime for nighttime cough.  Try warm salt water gargles for sore throat.  Stop all antihistamines for now, and other non-prescription cough/cold preparations. May take Tylenol if needed for fever, etc. Begin Azithromycin if not improving about one week or if persistent fever develops   (Given a prescription to hold, with an expiration date)       ED Prescriptions     Medication Sig Dispense Auth. Provider   azithromycin (ZITHROMAX Z-PAK) 250 MG tablet Take 2 tabs today; then begin one tab once daily for 4 more days 6 tablet , MD         Lattie Haw, MD 10/11/21 [phone]

## 2021-10-09 NOTE — Discharge Instructions (Addendum)
Take plain guaifenesin (1200mg  extended release tabs such as Mucinex) twice daily, with plenty of water, for cough and congestion. Get adequate rest.   May take Delsym Cough Suppressant ("12 Hour Cough Relief") at bedtime for nighttime cough.  Try warm salt water gargles for sore throat.  Stop all antihistamines for now, and other non-prescription cough/cold preparations. May take Tylenol if needed for fever, etc. Begin Azithromycin if not improving about one week or if persistent fever develops

## 2021-10-10 LAB — SARS-COV-2 RNA,(COVID-19) QUALITATIVE NAAT: SARS CoV2 RNA: NOT DETECTED

## 2021-11-07 ENCOUNTER — Ambulatory Visit
Admission: RE | Admit: 2021-11-07 | Discharge: 2021-11-07 | Disposition: A | Discharge status: Home / Self Care | Payer: Medicare Other | Source: Ambulatory Visit | Attending: Family Medicine | Admitting: Family Medicine

## 2021-11-07 VITALS — BP 192/72 | HR 78 | Temp 98.9°F | Resp 18 | Ht 64.0 in | Wt 130.0 lb

## 2021-11-07 DIAGNOSIS — N390 Urinary tract infection, site not specified: Secondary | ICD-10-CM | POA: Diagnosis not present

## 2021-11-07 DIAGNOSIS — Z881 Allergy status to other antibiotic agents status: Secondary | ICD-10-CM

## 2021-11-07 DIAGNOSIS — Z882 Allergy status to sulfonamides status: Secondary | ICD-10-CM | POA: Diagnosis not present

## 2021-11-07 MED ORDER — CEPHALEXIN 500 MG PO CAPS: 500.0000 mg | ORAL_CAPSULE | Freq: Two times a day (BID) | ORAL | 0 refills | Status: DC

## 2021-11-07 MED ORDER — METHYLPREDNISOLONE ACETATE 80 MG/ML IJ SUSP
80.0000 mg | Freq: Once | INTRAMUSCULAR | Status: AC
  Administered 2021-11-07: 80 mg via INTRAMUSCULAR

## 2021-11-07 NOTE — ED Triage Notes (Signed)
Patient states she was recently treated for a UTI, started on Bactrim yesterday, noticed her cheeks, arms and abdomen are all red.  No itching.  Patient has taken 2 doses of medication.  No SOB, some dizziness.

## 2021-11-07 NOTE — ED Provider Notes (Signed)
[name]    CSN: [id number] Arrival date & time: 11/07/21  1418      History   Chief Complaint Chief Complaint  Patient presents with   Medication Reaction    Entered by patient    HPI Laura Coleman is a 76 y.o. female.   HPI  Patient saw her primary care doctor for a bladder infection.  She was started on antibiotics.  She took Macrobid.  As soon as she stopped the antibiotics the symptoms came back.  She called her doctor and they prescribed Bactrim.  Patient took 2 doses of Bactrim and has broken out in a rash all over her body.  Her cheeks are red, her arms and legs have redness and some swelling.  She states she feels achy.  No trouble breathing.  No trouble swallowing.  No trouble speaking  Past Medical History:  Diagnosis Date   Hypertension     Patient Active Problem List   Diagnosis Date Noted   Crush injury, leg, lower, right, subsequent encounter 07/14/2021   Hematoma 07/14/2021   Effusion of right knee 07/14/2021    History reviewed. No pertinent surgical history.  OB History   No obstetric history on file.      Home Medications    Prior to Admission medications   Medication Sig Start Date End Date Taking? Authorizing Provider  cephALEXin (KEFLEX) 500 MG capsule Take 1 capsule (500 mg total) by mouth 2 (two) times daily. 11/07/21  Yes Eustace Moore, MD  losartan (COZAAR) 25 MG tablet Take 25 mg by mouth daily. 04/30/21  Yes [provider]    Family History Family History  Problem Relation Age of Onset   Cancer Mother    Osteoporosis Mother    Pneumonia Father     Social History Social History   Tobacco Use   Smoking status: Never   Smokeless tobacco: Never  Vaping Use   Vaping Use: Never used  Substance Use Topics   Alcohol use: Not Currently   Drug use: Not Currently     Allergies   Sulfamethoxazole-trimethoprim   Review of Systems Review of Systems See HPI  Physical Exam Triage Vital Signs ED  Triage Vitals  Enc Vitals Group     BP 11/07/21 1452 (!) 192/72     Pulse Rate 11/07/21 1452 78     Resp 11/07/21 1452 18     Temp 11/07/21 1452 98.9 F (37.2 C)     Temp Source 11/07/21 1452 Oral     SpO2 11/07/21 1452 100 %     Weight 11/07/21 1455 130 lb (59 kg)     Height 11/07/21 1455 5\' 4"  (1.626 m)     Head Circumference --      Peak Flow --      Pain Score 11/07/21 1455 0     Pain Loc --      Pain Edu? --      Excl. in GC? --    No data found.  Updated Vital Signs BP (!) 192/72 (BP Location: Left Arm)   Pulse 78   Temp 98.9 F (37.2 C) (Oral)   Resp 18   Ht 5\' 4"  (1.626 m)   Wt 59 kg   LMP 02/19/2020   SpO2 100%   BMI 22.31 kg/m       Physical Exam Constitutional:      General: She is not in acute distress.    Appearance: She is well-developed.  HENT:  Head: Normocephalic and atraumatic.  Eyes:     Conjunctiva/sclera: Conjunctivae normal.     Pupils: Pupils are equal, round, and reactive to light.  Cardiovascular:     Rate and Rhythm: Normal rate.  Pulmonary:     Effort: Pulmonary effort is normal. No respiratory distress.  Abdominal:     General: There is no distension.     Palpations: Abdomen is soft.     Tenderness: There is no right CVA tenderness or left CVA tenderness.  Musculoskeletal:        General: Normal range of motion.     Cervical back: Normal range of motion.  Skin:    General: Skin is warm and dry.     Findings: Erythema and rash present.     Comments: There is splotchy erythema on both forearms on face and neck that is easily visible.  Heart and lung exam normal.  Oropharynx benign  Neurological:     Mental Status: She is alert.     Gait: Gait normal.      UC Treatments / Results  Labs (all labs ordered are listed, but only abnormal results are displayed) Labs Reviewed - No data to display  EKG   Radiology No results found.  Procedures Procedures (including critical care time)  Medications Ordered in  UC Medications  methylPREDNISolone acetate (DEPO-MEDROL) injection 80 mg (has no administration in time range)    Initial Impression / Assessment and Plan / UC Course  I have reviewed the triage vital signs and the nursing notes.  Pertinent labs & imaging results that were available during my care of the patient were reviewed by me and considered in my medical decision making (see chart for details).     Patient acknowledges her blood pressure is elevated today.  To follow-up with her primary care doctor Patient is advised never to take a sulfa antibiotic again We gave her a shot of Depo-Medrol to fight the allergic reaction I am changing her to Keflex for the urinary tract infection.  I did look up with the urine culture.  She had E. 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 Provider   cephALEXin (KEFLEX) 500 MG capsule Take 1 capsule (500 mg total) by mouth 2 (two) times daily. 10 capsule Eustace Moore, MD      PDMP not reviewed this encounter.   Eustace Moore, MD 11/07/21 1524

## 2021-11-07 NOTE — Discharge Instructions (Addendum)
STOP SULFAMETHOXAZOLE/TRIMETHOPRIM ( SEPTRA/BACTRIM)  May take Benadryl for the itching Take cephalexin 2 times a day for 5 days for the bladder infection The steroid shot should help reduce your allergic reaction

## 2021-11-08 ENCOUNTER — Telehealth: Payer: Self-pay | Admitting: Emergency Medicine

## 2021-11-08 NOTE — Telephone Encounter (Signed)
LMTRC.  Advised if doing well, can disregard the call.  Any questions or concerns, please given the office a call back.

## 2021-11-10 ENCOUNTER — Telehealth: Payer: Self-pay | Admitting: Emergency Medicine

## 2021-11-10 NOTE — Telephone Encounter (Signed)
Call from [facility] regarding redness around eyes 3 hours after starting the keflex. Chart reviewed w/ provider who instructed patient to stop Keflex and if symptoms return to follow up w/ her PCP or to return to [facility] for a repeat U/A & culture. Initial visit was with her PCP and a urine culture was not sent per Care Everywhere. PT also instructed to go to ED if her eyes swell shut or she has SOB, pt verbalized an understanding.

## 2022-02-09 ENCOUNTER — Inpatient Hospital Stay: Admission: RE | Admit: 2022-02-09 | Payer: Self-pay | Source: Ambulatory Visit

## 2022-06-18 ENCOUNTER — Ambulatory Visit
Admission: RE | Admit: 2022-06-18 | Discharge: 2022-06-18 | Disposition: A | Discharge status: Home / Self Care | Payer: Medicare Other | Source: Ambulatory Visit

## 2022-06-18 ENCOUNTER — Telehealth: Payer: Self-pay | Admitting: Emergency Medicine

## 2022-06-18 VITALS — BP 172/85 | HR 85 | Temp 99.1°F | Resp 17

## 2022-06-18 DIAGNOSIS — J0100 Acute maxillary sinusitis, unspecified: Secondary | ICD-10-CM | POA: Diagnosis not present

## 2022-06-18 MED ORDER — AMOXICILLIN 875 MG PO TABS: 875.0000 mg | ORAL_TABLET | Freq: Two times a day (BID) | ORAL | 0 refills | Status: AC

## 2022-06-18 NOTE — Telephone Encounter (Signed)
Call back to patient regarding medication question. Pt's meds had been changed to [place] had to pay cash price since [facility] would not remove insurance from fill at pharmacy. Call by this RN to [facility] to cancel  fill on amoxil.RN spoke to [facility] at [facility]

## 2022-06-18 NOTE — ED Provider Notes (Addendum)
[facility]    CSN: [id number] Arrival date & time: 06/18/22  1058      History   Chief Complaint Chief Complaint  Patient presents with   Nasal Congestion   Cough    APPT 1115AM    HPI Laura Coleman is a 77 y.o. female.   HPI Pleasant 77 year old female presents with nasal congestion for 2 days.  PMH significant for HTN, hypothyroidism, and chronic lower back pain without sciatica.  Reports that she is scheduled for ENT follow-up in mid April.  Past Medical History:  Diagnosis Date   Hypertension     Patient Active Problem List   Diagnosis Date Noted   Crush injury, leg, lower, right, subsequent encounter 07/14/2021   Hematoma 07/14/2021   Effusion of right knee 07/14/2021    History reviewed. No pertinent surgical history.  OB History   No obstetric history on file.      Home Medications    Prior to Admission medications   Medication Sig Start Date End Date Taking? Authorizing Provider  amoxicillin (AMOXIL) 875 MG tablet Take 1 tablet (875 mg total) by mouth 2 (two) times daily for 7 days. 06/18/22 06/25/22 Yes Eliezer Lofts, FNP  amoxicillin (AMOXIL) 875 MG tablet Take 1 tablet (875 mg total) by mouth 2 (two) times daily for 7 days. 06/18/22 06/25/22 Yes Eliezer Lofts, FNP  aspirin EC 81 MG tablet  05/12/18  Yes [provider]  levothyroxine (SYNTHROID) 25 MCG tablet Take by mouth. 04/21/22  Yes [provider]  LUMIGAN 0.01 % SOLN Place 1 drop into both eyes. 01/16/13  Yes [provider]  losartan (COZAAR) 25 MG tablet Take 25 mg by mouth daily. 04/30/21   [provider]    Family History Family History  Problem Relation Age of Onset   Cancer Mother    Osteoporosis Mother    Pneumonia Father     Social History Social History   Tobacco Use   Smoking status: Never   Smokeless tobacco: Never  Vaping Use   Vaping Use: Never used  Substance Use Topics   Alcohol use: Not Currently   Drug use: Not  Currently     Allergies   Sulfamethoxazole-trimethoprim   Review of Systems Review of Systems  HENT:  Positive for congestion, postnasal drip and sinus pain.   All other systems reviewed and are negative.    Physical Exam Triage Vital Signs ED Triage Vitals  Enc Vitals Group     BP      Pulse      Resp      Temp      Temp src      SpO2      Weight      Height      Head Circumference      Peak Flow      Pain Score      Pain Loc      Pain Edu?      Excl. in [place]?    No data found.  Updated Vital Signs BP (!) 172/85 (BP Location: Left Arm)   Pulse 85   Temp 99.1 F (37.3 C) (Oral)   Resp 17   LMP 02/19/2020   SpO2 98%      Physical Exam Vitals and nursing note reviewed.  Constitutional:      Appearance: Normal appearance. She is normal weight. She is ill-appearing.  HENT:     Head: Normocephalic and atraumatic.     Right Ear:  External ear normal.     Left Ear: External ear normal.     Ears:     Comments: TM's are clear, retracted with good light reflex; significant eustachian tube dysfunction noted bilaterally    Nose:     Comments: Turbinates are erythematous/edematous/enlarged    Mouth/Throat:     Mouth: Mucous membranes are moist.     Pharynx: Oropharynx is clear.  Eyes:     Extraocular Movements: Extraocular movements intact.     Conjunctiva/sclera: Conjunctivae normal.     Pupils: Pupils are equal, round, and reactive to light.  Cardiovascular:     Rate and Rhythm: Normal rate and regular rhythm.     Pulses: Normal pulses.     Heart sounds: Normal heart sounds.  Pulmonary:     Effort: Pulmonary effort is normal.     Breath sounds: Normal breath sounds. No wheezing, rhonchi or rales.  Musculoskeletal:        General: Normal range of motion.     Cervical back: Normal range of motion and neck supple.  Skin:    General: Skin is warm and dry.  Neurological:     General: No focal deficit present.     Mental Status: She is alert and oriented to  person, place, and time. Mental status is at baseline.  Psychiatric:        Mood and Affect: Mood normal.        Behavior: Behavior normal.      UC Treatments / Results  Labs (all labs ordered are listed, but only abnormal results are displayed) Labs Reviewed - No data to display  EKG   Radiology No results found.  Procedures Procedures (including critical care time)  Medications Ordered in UC Medications - No data to display  Initial Impression / Assessment and Plan / UC Course  I have reviewed the triage vital signs and the nursing notes.  Pertinent labs & imaging results that were available during my care of the patient were reviewed by me and considered in my medical decision making (see chart for details).     MDM: 1.  Acute maxillary sinusitis, recurrence not specified-Rx'd amoxicillin 875 mg twice daily for 7 days. Instructed patient take medication as directed with food to completion.  Encouraged patient to increase daily water intake to 64 ounces per day while taking this medication.  Advised if symptoms worsen and/or unresolved please follow-up with PCP or here for further evaluation.  Patient discharged home, hemodynamically stable. Final Clinical Impressions(s) / UC Diagnoses   Final diagnoses:  Acute maxillary sinusitis, recurrence not specified     Discharge Instructions      Instructed patient take medication as directed with food to completion.  Encouraged patient to increase daily water intake to 64 ounces per day while taking this medication.  Advised if symptoms worsen and/or unresolved please follow-up with PCP or here for further evaluation.     ED Prescriptions     Medication Sig Dispense Auth. Provider   amoxicillin (AMOXIL) 875 MG tablet Take 1 tablet (875 mg total) by mouth 2 (two) times daily for 7 days. 14 tablet Eliezer Lofts, FNP   amoxicillin (AMOXIL) 875 MG tablet Take 1 tablet (875 mg total) by mouth 2 (two) times daily for 7 days.  14 tablet Eliezer Lofts, FNP      PDMP not reviewed this encounter.   Eliezer Lofts, [place] 06/18/22 [place], [place], [place] 06/18/22 1225

## 2022-06-18 NOTE — ED Triage Notes (Signed)
Pt c/o cough since Sunday. Congestion x 2 days. No known fever. Neg covid test on Wed. No OTC meds tried.

## 2022-06-18 NOTE — Discharge Instructions (Addendum)
Instructed patient take medication as directed with food to completion.  Encouraged patient to increase daily water intake to 64 ounces per day while taking this medication.  Advised if symptoms worsen and/or unresolved please follow-up with PCP or here for further evaluation.

## 2022-07-17 ENCOUNTER — Ambulatory Visit
Admission: RE | Admit: 2022-07-17 | Discharge: 2022-07-17 | Disposition: A | Discharge status: Home / Self Care | Payer: Medicare Other | Source: Ambulatory Visit | Attending: Family Medicine | Admitting: Family Medicine

## 2022-07-17 VITALS — BP 199/82 | HR 69 | Temp 97.1°F | Resp 16 | Ht 64.0 in | Wt 132.0 lb

## 2022-07-17 DIAGNOSIS — H9201 Otalgia, right ear: Secondary | ICD-10-CM

## 2022-07-17 DIAGNOSIS — I10 Essential (primary) hypertension: Secondary | ICD-10-CM | POA: Diagnosis not present

## 2022-07-17 HISTORY — DX: Disorder of thyroid, unspecified: E07.9

## 2022-07-17 NOTE — ED Provider Notes (Signed)
[name]    CSN: [id number] Arrival date & time: 07/17/22  1231      History   Chief Complaint Chief Complaint  Patient presents with   Otalgia    HPI Wende Astudillo is a 77 y.o. female.   HPI Patient had a sinus infection mid May.  She came here and was given amoxicillin.  She failed to improve over the next few days.  She saw her doctor and was placed on doxycycline.  She states that the sinus symptoms improved but have not completely gone away, woke up this morning with right ear pain.  She took some ibuprofen and Tylenol.  She is feeling somewhat better.  She is here to have her ear checked Past Medical History:  Diagnosis Date   Hypertension    Thyroid disease     Patient Active Problem List   Diagnosis Date Noted   Crush injury, leg, lower, right, subsequent encounter 07/14/2021   Hematoma 07/14/2021   Effusion of right knee 07/14/2021    Past Surgical History:  Procedure Laterality Date   ABDOMINAL HYSTERECTOMY      OB History   No obstetric history on file.      Home Medications    Prior to Admission medications   Medication Sig Start Date End Date Taking? Authorizing Provider  aspirin EC 81 MG tablet  05/12/18   [provider]  levothyroxine (SYNTHROID) 25 MCG tablet Take by mouth. 04/21/22   [provider]  losartan (COZAAR) 25 MG tablet Take 25 mg by mouth daily. 04/30/21   [provider]  LUMIGAN 0.01 % SOLN Place 1 drop into both eyes. 01/16/13   [provider]    Family History Family History  Problem Relation Age of Onset   Cancer Mother    Osteoporosis Mother    Pneumonia Father     Social History Social History   Tobacco Use   Smoking status: Never   Smokeless tobacco: Never  Vaping Use   Vaping Use: Never used  Substance Use Topics   Alcohol use: Not Currently   Drug use: Not Currently     Allergies   Sulfamethoxazole-trimethoprim and Cephalexin   Review of Systems Review  of Systems See HPI  Physical Exam Triage Vital Signs ED Triage Vitals  Enc Vitals Group     BP 07/17/22 1238 (!) 199/82     Pulse Rate 07/17/22 1238 69     Resp 07/17/22 1238 16     Temp 07/17/22 1238 (!) 97.1 F (36.2 C)     Temp Source 07/17/22 1238 Oral     SpO2 07/17/22 1238 99 %     Weight 07/17/22 1243 132 lb (59.9 kg)     Height 07/17/22 1243 5\' 4"  (1.626 m)     Head Circumference --      Peak Flow --      Pain Score 07/17/22 1243 4     Pain Loc --      Pain Edu? --      Excl. in [place]? --    No data found.  Updated Vital Signs BP (!) 199/82 (BP Location: Left Arm) Comment: HTN hx - took meds this am  Pulse 69   Temp (!) 97.1 F (36.2 C) (Oral)   Resp 16   Ht 5\' 4"  (1.626 m)   Wt 59.9 kg   LMP 02/19/2020   SpO2 99%   BMI 22.66 kg/m      Physical Exam Constitutional:  General: She is not in acute distress.    Appearance: She is well-developed and normal weight.  HENT:     Head: Normocephalic and atraumatic.     Right Ear: Tympanic membrane, ear canal and external ear normal.     Left Ear: Tympanic membrane, ear canal and external ear normal.  Eyes:     Conjunctiva/sclera: Conjunctivae normal.     Pupils: Pupils are equal, round, and reactive to light.  Cardiovascular:     Rate and Rhythm: Normal rate.  Pulmonary:     Effort: Pulmonary effort is normal. No respiratory distress.  Abdominal:     General: There is no distension.     Palpations: Abdomen is soft.  Musculoskeletal:        General: Normal range of motion.     Cervical back: Normal range of motion.  Skin:    General: Skin is warm and dry.  Neurological:     Mental Status: She is alert.      UC Treatments / Results  Labs (all labs ordered are listed, but only abnormal results are displayed) Labs Reviewed - No data to display  EKG   Radiology No results found.  Procedures Procedures (including critical care time)  Medications Ordered in UC Medications - No data to  display  Initial Impression / Assessment and Plan / UC Course  I have reviewed the triage vital signs and the nursing notes.  Pertinent labs & imaging results that were available during my care of the patient were reviewed by me and considered in my medical decision making (see chart for details).     Your exam is normal.  Otalgia likely from eustachian tube dysfunction Patient's blood pressure is elevated.  She states is always elevated at the doctor's office.  She states at home it stays in the 120/70 range.  I will leave blood pressure management to her primary care doctor Final Clinical Impressions(s) / UC Diagnoses   Final diagnoses:  Otalgia of right ear  Elevated blood pressure reading in office with diagnosis of hypertension     Discharge Instructions      See ENT in May   ED Prescriptions   None    PDMP not reviewed this encounter.   Eustace Moore, MD 07/17/22 1259

## 2022-07-17 NOTE — Discharge Instructions (Signed)
See ENT in May

## 2022-07-17 NOTE — ED Triage Notes (Signed)
R ear pain since this morning Radiates to base of skull Ibuprofen 200mg  at 0800 - min relief Denies fever

## 2022-07-19 ENCOUNTER — Encounter: Payer: Self-pay | Admitting: *Deleted

## 2022-12-13 LAB — COLOGUARD: COLOGUARD: NEGATIVE

## 2022-12-13 LAB — EXTERNAL GENERIC LAB PROCEDURE: COLOGUARD: NEGATIVE

## 2022-12-28 ENCOUNTER — Ambulatory Visit
Admission: RE | Admit: 2022-12-28 | Discharge: 2022-12-28 | Disposition: A | Discharge status: Home / Self Care | Payer: Medicare Other | Source: Ambulatory Visit | Attending: Family Medicine | Admitting: Family Medicine

## 2022-12-28 VITALS — BP 199/81 | HR 77 | Temp 98.2°F | Resp 17

## 2022-12-28 DIAGNOSIS — S00412A Abrasion of left ear, initial encounter: Secondary | ICD-10-CM | POA: Diagnosis not present

## 2022-12-28 DIAGNOSIS — I10 Essential (primary) hypertension: Secondary | ICD-10-CM

## 2022-12-28 NOTE — ED Provider Notes (Signed)
[name]    CSN: [id number] Arrival date & time: 12/28/22  1745      History   Chief Complaint Chief Complaint  Patient presents with   Ear Injury    Pain, LT    HPI Yamira Vullo is a 77 y.o. female.   Pleasant 77 year old female presents today primarily to have her left ear canal evaluated.  She does wear bilateral hearing aids.  She is 3 days ago she had some irritation so used a Q-tip to itch her ear canal.  She noted some small amount of blood on the Q-tip and was concerned.  She denies any hearing loss.  She denies any pain, discharge, edema or drainage.  She has not put her hearing aid back into the canal until it is further evaluated to ensure everything is "okay".  She denies any URI symptoms. Did try a small amount of neosporin to the ear canal x 1. Additionally we addressed her blood pressure.  She does have a known history of hypertension.  She does take losartan 50 mg.  Usually it is well-controlled, she follows with PCP.  Blood pressure at her appointment with PCP in July was 120/80.  She believes her elevated pressure today is because she did not drink enough water.  She denies any headache, dizziness, tinnitus, chest pain or palpitations.     Past Medical History:  Diagnosis Date   Hypertension    Thyroid disease     Patient Active Problem List   Diagnosis Date Noted   Crush injury, leg, lower, right, subsequent encounter 07/14/2021   Hematoma 07/14/2021   Effusion of right knee 07/14/2021    Past Surgical History:  Procedure Laterality Date   ABDOMINAL HYSTERECTOMY      OB History   No obstetric history on file.      Home Medications    Prior to Admission medications   Medication Sig Start Date End Date Taking? Authorizing Provider  levothyroxine (SYNTHROID) 25 MCG tablet Take by mouth. 04/21/22   [provider]  losartan (COZAAR) 25 MG tablet Take 25 mg by mouth daily. 04/30/21   [provider]  LUMIGAN 0.01 %  SOLN Place 1 drop into both eyes. 01/16/13   [provider]    Family History Family History  Problem Relation Age of Onset   Cancer Mother    Osteoporosis Mother    Pneumonia Father     Social History Social History   Tobacco Use   Smoking status: Never   Smokeless tobacco: Never  Vaping Use   Vaping status: Never Used  Substance Use Topics   Alcohol use: Not Currently   Drug use: Not Currently     Allergies   Sulfamethoxazole-trimethoprim and Cephalexin   Review of Systems Review of Systems As per HPI  Physical Exam Triage Vital Signs ED Triage Vitals [12/28/22 1754]  Encounter Vitals Group     BP (!) 196/84     Systolic BP Percentile      Diastolic BP Percentile      Pulse Rate 77     Resp 17     Temp 98.2 F (36.8 C)     Temp Source Oral     SpO2 97 %     Weight      Height      Head Circumference      Peak Flow      Pain Score 0     Pain Loc  Pain Education      Exclude from Growth Chart    No data found.  Updated Vital Signs BP (!) 199/81   Pulse 77   Temp 98.2 F (36.8 C) (Oral)   Resp 17   LMP 02/19/2020   SpO2 97%   Visual Acuity Right Eye Distance:   Left Eye Distance:   Bilateral Distance:    Right Eye Near:   Left Eye Near:    Bilateral Near:     Physical Exam Vitals and nursing note reviewed.  Constitutional:      General: She is not in acute distress.    Appearance: Normal appearance. She is normal weight. She is not ill-appearing, toxic-appearing or diaphoretic.  HENT:     Head: Normocephalic and atraumatic.     Jaw: There is normal jaw occlusion. No trismus or swelling.     Salivary Glands: Right salivary gland is not diffusely enlarged or tender. Left salivary gland is not diffusely enlarged or tender.     Right Ear: Hearing normal. No decreased hearing noted. No drainage, swelling or tenderness. No middle ear effusion. There is no impacted cerumen. No mastoid tenderness. No hemotympanum. Tympanic  membrane is not injected, scarred, perforated, erythematous or retracted.     Left Ear: Hearing normal. No decreased hearing noted. No drainage, swelling or tenderness.  No middle ear effusion. There is no impacted cerumen. No mastoid tenderness. No hemotympanum. Tympanic membrane is not injected, scarred, perforated, erythematous or retracted.     Ears:     Comments: Small scab noted to anterior portion of EAC on L only without edema, active bleeding or drainage.     Nose: Nose normal. No congestion.     Mouth/Throat:     Mouth: Mucous membranes are moist.  Eyes:     General:        Right eye: No discharge.        Left eye: No discharge.     Extraocular Movements: Extraocular movements intact.     Pupils: Pupils are equal, round, and reactive to light.  Cardiovascular:     Rate and Rhythm: Normal rate.  Musculoskeletal:     Cervical back: Normal range of motion.  Lymphadenopathy:     Cervical: No cervical adenopathy.  Skin:    General: Skin is warm and dry.     Findings: No erythema or rash.  Neurological:     General: No focal deficit present.     Mental Status: She is alert and oriented to person, place, and time.      UC Treatments / Results  Labs (all labs ordered are listed, but only abnormal results are displayed) Labs Reviewed - No data to display  EKG   Radiology No results found.  Procedures Procedures (including critical care time)  Medications Ordered in UC Medications - No data to display  Initial Impression / Assessment and Plan / UC Course  I have reviewed the triage vital signs and the nursing notes.  Pertinent labs & imaging results that were available during my care of the patient were reviewed by me and considered in my medical decision making (see chart for details).     Abrasion L ear canal -this is mild and healing.  No active infection or bleeding noted, no treatment is indicated at this time. HTN -monitored and managed by PCP.  Patient  does have a blood pressure cuff at home however has not been monitoring.  Previously well-controlled.  Patient attributes this to not  enough water today.  She is asymptomatic.  I recommended she take her losartan tomorrow morning and double check her blood pressure at home roughly 1 hour after taking.  Should blood pressure remain elevated she has to follow-up with her PCP.   Final Clinical Impressions(s) / UC Diagnoses   Final diagnoses:  Abrasion of left ear canal, initial encounter  Essential hypertension   Discharge Instructions   None    ED Prescriptions   None    PDMP not reviewed this encounter.   Maretta Bees, [place] 12/28/22 1935

## 2022-12-28 NOTE — Discharge Instructions (Signed)
Your ear canal shows no signs of infection.  Please avoid Q tips in the ear canal. You are safe to resume the use of your hearing aid on the left.  Please continue taking your blood pressure medication as prescribed. Use your BP cuff tomorrow to check your pressure. If it remains elevated please contact your PCP for further review and management.

## 2022-12-28 NOTE — ED Triage Notes (Signed)
Pt c/o LT ear pain x 3 days. Says she has been wearing hearing aids and tried to clean her ears with q-tip and scratched inside her ear causing her pain.

## 2023-01-09 ENCOUNTER — Ambulatory Visit
Admission: RE | Admit: 2023-01-09 | Discharge: 2023-01-09 | Disposition: A | Discharge status: Home / Self Care | Payer: Medicare Other | Source: Ambulatory Visit | Attending: Family Medicine | Admitting: Family Medicine

## 2023-01-09 ENCOUNTER — Other Ambulatory Visit: Payer: Self-pay

## 2023-01-09 VITALS — BP 172/90 | HR 72 | Temp 97.7°F | Resp 16

## 2023-01-09 DIAGNOSIS — L2989 Other pruritus: Secondary | ICD-10-CM | POA: Diagnosis not present

## 2023-01-09 DIAGNOSIS — R21 Rash and other nonspecific skin eruption: Secondary | ICD-10-CM

## 2023-01-09 MED ORDER — METHYLPREDNISOLONE 4 MG PO TBPK: ORAL_TABLET | ORAL | 0 refills | Status: AC

## 2023-01-09 NOTE — ED Triage Notes (Signed)
Pt c/o rash to left FA that she states is now spreading (getting larger). Patient states she noticed the rash about a week ago.   Home interventions: none

## 2023-01-09 NOTE — Discharge Instructions (Addendum)
Advised patient to take medication as directed with food to completion.  Encouraged to increase daily water intake to 64 ounces per day while taking this medication.  Advised patient to monitor daily blood pressure before eating for the next 7 days and to record blood pressure measurements so that PCP may see daily blood pressure trends.  Advised if symptoms worsen and/or unresolved please follow-up with PCP or here for further evaluation.

## 2023-01-09 NOTE — ED Provider Notes (Signed)
[name]    CSN: [id number] Arrival date & time: 01/09/23  1019      History   Chief Complaint Chief Complaint  Patient presents with   Skin Ulcer    Rush on my left arm. It looks spreading. - Entered by patient   Rash    HPI Laura Coleman is a 77 y.o. female.   HPI pleasant 77 year old female presents with [place] rash of left arm.  PMH significant for HTN and thyroid disease.  Past Medical History:  Diagnosis Date   Hypertension    Thyroid disease     Patient Active Problem List   Diagnosis Date Noted   Crush injury, leg, lower, right, subsequent encounter 07/14/2021   Hematoma 07/14/2021   Effusion of right knee 07/14/2021    Past Surgical History:  Procedure Laterality Date   ABDOMINAL HYSTERECTOMY      OB History   No obstetric history on file.      Home Medications    Prior to Admission medications   Medication Sig Start Date End Date Taking? Authorizing Provider  levothyroxine (SYNTHROID) 25 MCG tablet Take by mouth. 04/21/22   [provider]  losartan (COZAAR) 25 MG tablet Take 25 mg by mouth daily. 04/30/21   [provider]  LUMIGAN 0.01 % SOLN Place 1 drop into both eyes. 01/16/13   [provider]  methylPREDNISolone (MEDROL DOSEPAK) 4 MG TBPK tablet Take as directed 01/09/23  Yes Trevor Iha, FNP    Family History Family History  Problem Relation Age of Onset   Cancer Mother    Osteoporosis Mother    Pneumonia Father     Social History Social History   Tobacco Use   Smoking status: Never   Smokeless tobacco: Never  Vaping Use   Vaping status: Never Used  Substance Use Topics   Alcohol use: Not Currently   Drug use: Not Currently     Allergies   Sulfamethoxazole-trimethoprim and Cephalexin   Review of Systems Review of Systems  Skin:  Positive for rash.  All other systems reviewed and are negative.    Physical Exam Triage Vital Signs ED Triage Vitals  Encounter Vitals Group      BP 01/09/23 1031 (!) 188/82     Systolic BP Percentile --      Diastolic BP Percentile --      Pulse Rate 01/09/23 1031 72     Resp 01/09/23 1031 16     Temp 01/09/23 1031 97.7 F (36.5 C)     Temp Source 01/09/23 1031 Oral     SpO2 01/09/23 1031 99 %     Weight --      Height --      Head Circumference --      Peak Flow --      Pain Score 01/09/23 1030 0     Pain Loc --      Pain Education --      Exclude from Growth Chart --    No data found.  Updated Vital Signs BP (!) 172/90 (BP Location: Right Arm) Comment: provider made aware  Pulse 72   Temp 97.7 F (36.5 C) (Oral)   Resp 16   LMP 02/19/2020   SpO2 99%       Physical Exam Vitals and nursing note reviewed.  Constitutional:      Appearance: Normal appearance. She is normal weight.  HENT:     Head: Normocephalic and atraumatic.     Mouth/Throat:  Mouth: Mucous membranes are moist.     Pharynx: Oropharynx is clear.  Eyes:     Extraocular Movements: Extraocular movements intact.     Conjunctiva/sclera: Conjunctivae normal.     Pupils: Pupils are equal, round, and reactive to light.  Cardiovascular:     Rate and Rhythm: Normal rate and regular rhythm.     Pulses: Normal pulses.     Heart sounds: Normal heart sounds.  Pulmonary:     Effort: Pulmonary effort is normal.     Breath sounds: Normal breath sounds. No wheezing, rhonchi or rales.  Musculoskeletal:        General: Normal range of motion.     Cervical back: Normal range of motion and neck supple.  Skin:    General: Skin is warm and dry.     Comments: Left arm (volar aspect): Erythematous pruritic maculopapular eruption-please see image below  Neurological:     General: No focal deficit present.     Mental Status: She is alert and oriented to person, place, and time. Mental status is at baseline.  Psychiatric:        Mood and Affect: Mood normal.        Behavior: Behavior normal.        Thought Content: Thought content normal.      UC  Treatments / Results  Labs (all labs ordered are listed, but only abnormal results are displayed) Labs Reviewed - No data to display  EKG   Radiology No results found.  Procedures Procedures (including critical care time)  Medications Ordered in UC Medications - No data to display  Initial Impression / Assessment and Plan / UC Course  I have reviewed the triage vital signs and the nursing notes.  Pertinent labs & imaging results that were available during my care of the patient were reviewed by me and considered in my medical decision making (see chart for details).     MDM: 1.  Rash and nonspecific skin eruption-Rx'd Methylprednisone (Medrol Dosepak 4 mg) take as directed. 2.  Pruritic erythematous rash-Rx'd Methylprednisone (Medrol Dosepak 4 mg) take as directed. Advised patient to take medication as directed with food to completion.  Encouraged to increase daily water intake to 64 ounces per day while taking this medication.  Advised patient to monitor daily blood pressure before eating for the next 7 days and to record blood pressure measurements so that PCP may see daily blood pressure trends.  Advised if symptoms worsen and/or unresolved please follow-up with PCP or here for further evaluation.  Final Clinical Impressions(s) / UC Diagnoses   Final diagnoses:  Rash and nonspecific skin eruption  Pruritic erythematous rash     Discharge Instructions      Advised patient to take medication as directed with food to completion.  Encouraged to increase daily water intake to 64 ounces per day while taking this medication.  Advised patient to monitor daily blood pressure before eating for the next 7 days and to record blood pressure measurements so that PCP may see daily blood pressure trends.  Advised if symptoms worsen and/or unresolved please follow-up with PCP or here for further evaluation.     ED Prescriptions     Medication Sig Dispense Auth. Provider    methylPREDNISolone (MEDROL DOSEPAK) 4 MG TBPK tablet Take as directed 1 each Trevor Iha, FNP      PDMP not reviewed this encounter.   Trevor Iha, FNP 01/09/23 1253

## 2023-01-13 ENCOUNTER — Telehealth: Payer: Self-pay

## 2023-01-13 NOTE — Telephone Encounter (Signed)
Pt called stating rash has not improved much. Advised to finish rx in full and monitor a few days after. If still no improvement, f/u with PCP or return to clinic for re eval. Pt verbalized understanding.

## 2023-04-29 IMAGING — DX DG KNEE 1-2V*R*
2 series · 2 of 2 positions shown · non-contrast
Comparison: None.

CLINICAL DATA: Fall, right knee pain

EXAM:
RIGHT KNEE - 1-2 VIEW

[knee ap]
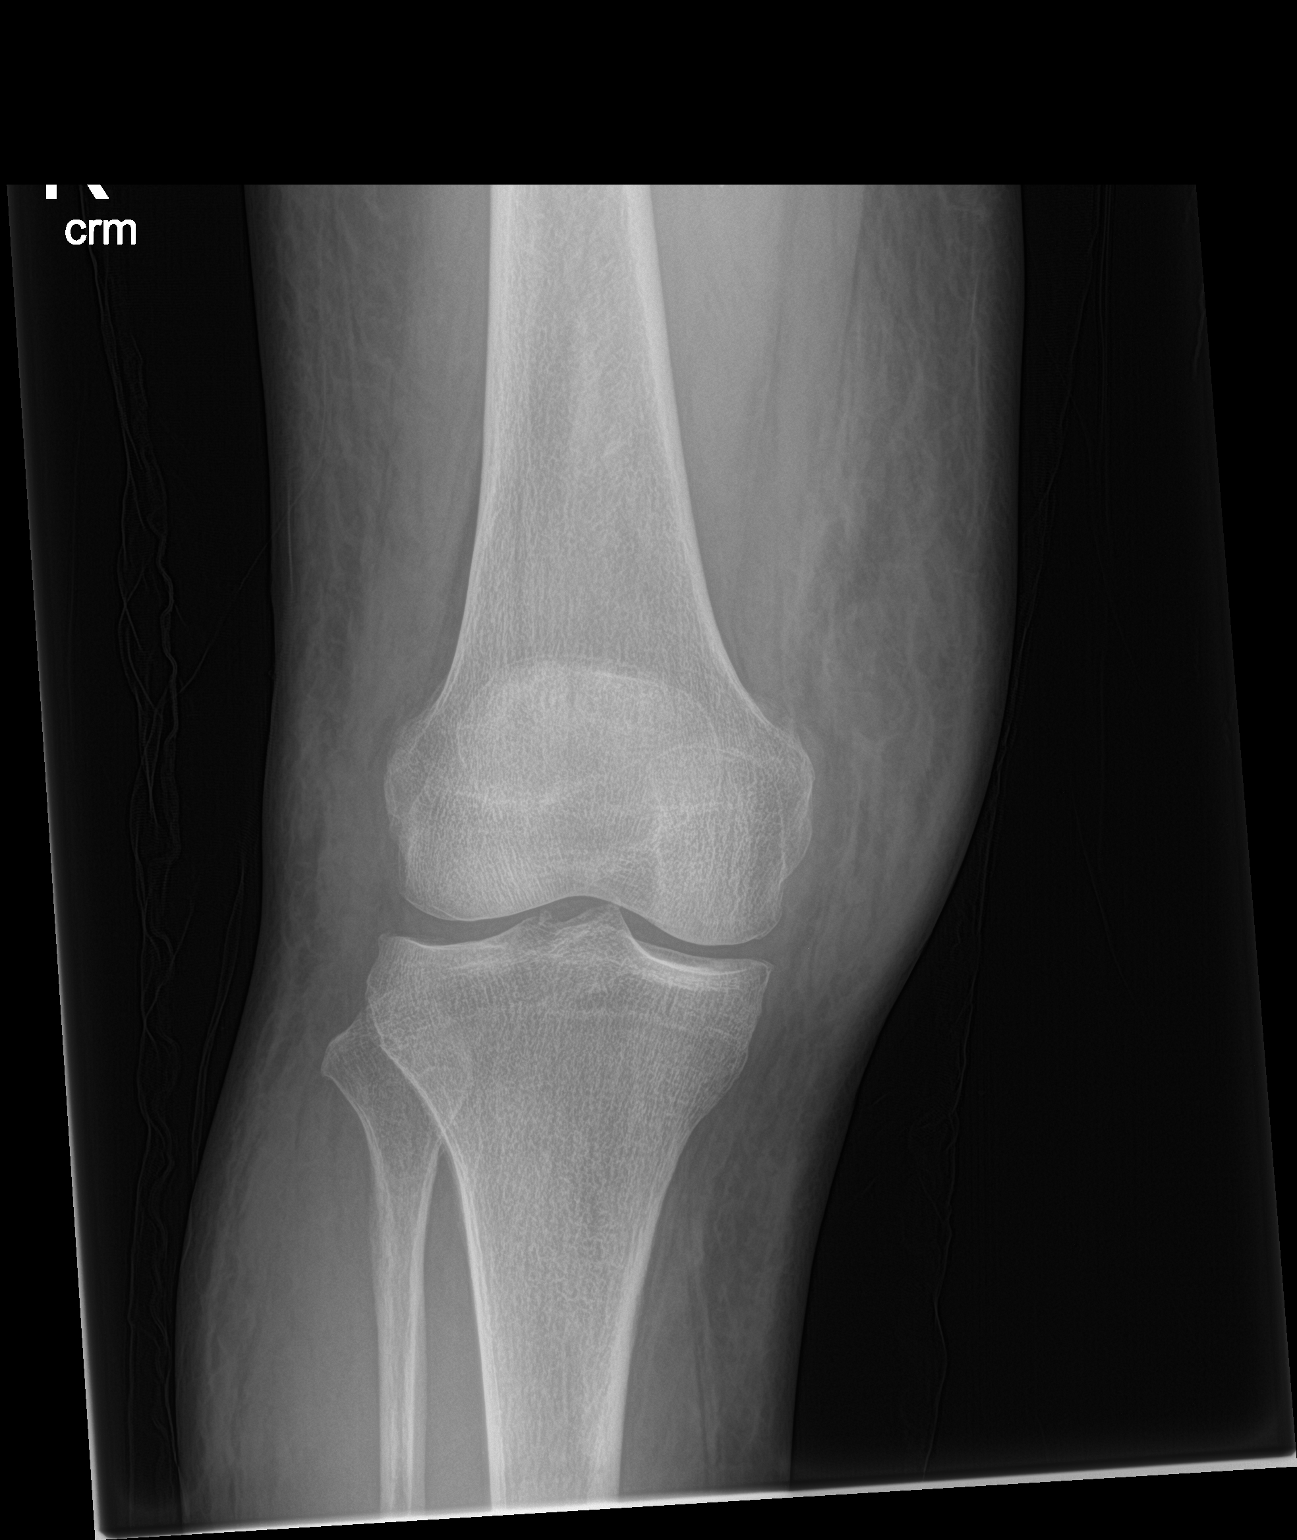

[knee lat]
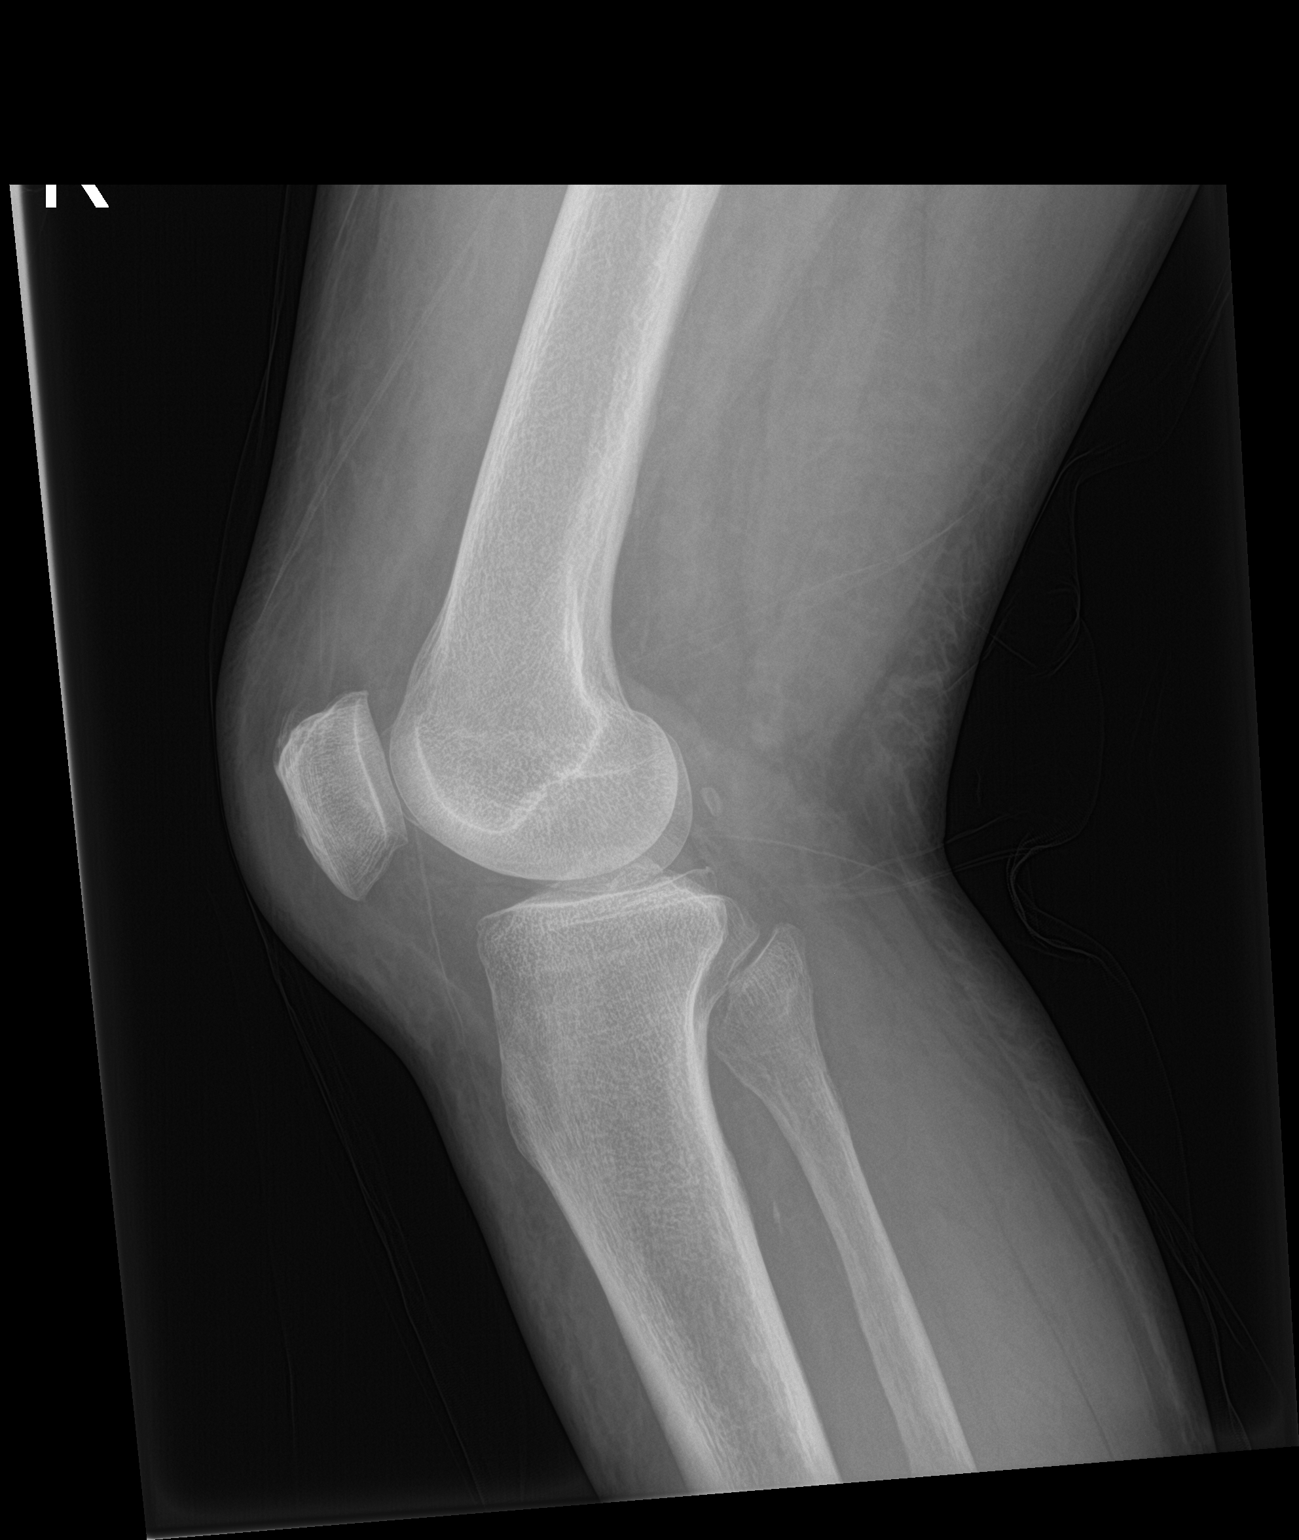

[2 of 2 positions shown; findings below may reference images not displayed]

FINDINGS: Diffuse soft tissue swelling. No acute bony abnormality.
Specifically, no fracture, subluxation, or dislocation. No joint
effusion.
IMPRESSION: No acute bony abnormality.

## 2023-07-12 ENCOUNTER — Ambulatory Visit: Admitting: Sports Medicine

## 2023-07-12 ENCOUNTER — Encounter: Payer: Self-pay | Admitting: Sports Medicine

## 2023-07-12 VITALS — BP 130/60 | Ht 64.0 in | Wt 132.0 lb

## 2023-07-12 DIAGNOSIS — R29898 Other symptoms and signs involving the musculoskeletal system: Secondary | ICD-10-CM
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
## 2023-07-18 ENCOUNTER — Other Ambulatory Visit: Payer: Self-pay

## 2023-07-18 ENCOUNTER — Ambulatory Visit: Attending: Sports Medicine

## 2023-07-18 DIAGNOSIS — R29898 Other symptoms and signs involving the musculoskeletal system: Secondary | ICD-10-CM | POA: Diagnosis present

## 2023-07-18 DIAGNOSIS — M25661 Stiffness of right knee, not elsewhere classified: Secondary | ICD-10-CM | POA: Insufficient documentation

## 2023-07-18 DIAGNOSIS — M6281 Muscle weakness (generalized): Secondary | ICD-10-CM | POA: Diagnosis present

## 2023-07-18 DIAGNOSIS — R262 Difficulty in walking, not elsewhere classified: Secondary | ICD-10-CM | POA: Insufficient documentation

## 2023-07-20 ENCOUNTER — Ambulatory Visit: Admitting: Physical Therapy

## 2023-07-21 ENCOUNTER — Ambulatory Visit

## 2023-07-21 DIAGNOSIS — M6281 Muscle weakness (generalized): Secondary | ICD-10-CM

## 2023-07-21 DIAGNOSIS — R29898 Other symptoms and signs involving the musculoskeletal system: Secondary | ICD-10-CM

## 2023-07-21 DIAGNOSIS — R262 Difficulty in walking, not elsewhere classified: Secondary | ICD-10-CM

## 2023-07-21 DIAGNOSIS — M25661 Stiffness of right knee, not elsewhere classified: Secondary | ICD-10-CM
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
## 2023-07-25 ENCOUNTER — Ambulatory Visit

## 2023-07-25 DIAGNOSIS — M6281 Muscle weakness (generalized): Secondary | ICD-10-CM

## 2023-07-25 DIAGNOSIS — R262 Difficulty in walking, not elsewhere classified: Secondary | ICD-10-CM
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
## 2023-07-28 ENCOUNTER — Ambulatory Visit

## 2023-07-28 DIAGNOSIS — M6281 Muscle weakness (generalized): Secondary | ICD-10-CM | POA: Diagnosis not present

## 2023-07-28 DIAGNOSIS — R29898 Other symptoms and signs involving the musculoskeletal system: Secondary | ICD-10-CM

## 2023-07-28 DIAGNOSIS — R262 Difficulty in walking, not elsewhere classified: Secondary | ICD-10-CM

## 2023-07-28 DIAGNOSIS — M25661 Stiffness of right knee, not elsewhere classified: Secondary | ICD-10-CM
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
## 2023-08-01 ENCOUNTER — Ambulatory Visit

## 2023-08-01 DIAGNOSIS — M6281 Muscle weakness (generalized): Secondary | ICD-10-CM | POA: Diagnosis not present

## 2023-08-01 DIAGNOSIS — R262 Difficulty in walking, not elsewhere classified: Secondary | ICD-10-CM
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
## 2023-08-04 ENCOUNTER — Ambulatory Visit: Attending: Sports Medicine

## 2023-08-04 DIAGNOSIS — M6281 Muscle weakness (generalized): Secondary | ICD-10-CM

## 2023-08-04 DIAGNOSIS — R262 Difficulty in walking, not elsewhere classified: Secondary | ICD-10-CM

## 2023-08-04 DIAGNOSIS — M25661 Stiffness of right knee, not elsewhere classified: Secondary | ICD-10-CM | POA: Diagnosis present

## 2023-08-04 DIAGNOSIS — R29898 Other symptoms and signs involving the musculoskeletal system: Secondary | ICD-10-CM | POA: Diagnosis present
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
## 2023-08-08 ENCOUNTER — Ambulatory Visit

## 2023-08-08 DIAGNOSIS — M6281 Muscle weakness (generalized): Secondary | ICD-10-CM

## 2023-08-08 DIAGNOSIS — R29898 Other symptoms and signs involving the musculoskeletal system: Secondary | ICD-10-CM

## 2023-08-08 DIAGNOSIS — R262 Difficulty in walking, not elsewhere classified: Secondary | ICD-10-CM

## 2023-08-08 DIAGNOSIS — M25661 Stiffness of right knee, not elsewhere classified: Secondary | ICD-10-CM
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
## 2023-08-11 ENCOUNTER — Encounter
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
## 2023-08-12 ENCOUNTER — Ambulatory Visit

## 2023-08-12 DIAGNOSIS — M6281 Muscle weakness (generalized): Secondary | ICD-10-CM

## 2023-08-12 DIAGNOSIS — R262 Difficulty in walking, not elsewhere classified: Secondary | ICD-10-CM

## 2023-10-11 ENCOUNTER — Ambulatory Visit: Admitting: Sports Medicine

## 2023-10-12 ENCOUNTER — Encounter: Payer: Self-pay | Admitting: Sports Medicine

## 2023-10-12 ENCOUNTER — Ambulatory Visit (INDEPENDENT_AMBULATORY_CARE_PROVIDER_SITE_OTHER): Admitting: Sports Medicine

## 2023-10-12 VITALS — BP 120/66 | Ht 64.0 in | Wt 132.0 lb

## 2023-10-12 DIAGNOSIS — R29898 Other symptoms and signs involving the musculoskeletal system: Secondary | ICD-10-CM | POA: Diagnosis not present

## 2023-10-12 NOTE — Progress Notes (Signed)
 Patient ID: Laura Coleman, female   DOB: 1945/07/11, 78 y.o.   MRN: [id number]  Patient presents today for follow-up on generalized right lower extremity weakness.  She has been discharged from formal physical therapy but continues with her home exercises 3 times a week.  She has noticed some prominence of the medial joint line of her right knee but it is not painful.  Physical exam was not repeated today.  We simply talked about the importance of continuing her home exercises.  She has improved both her balance and her walking and continues to work on strengthening.  Given her overall improvement, I would recommend no further workup or treatment at this time.  I will discharge her to follow-up as needed.  This note was dictated using Dragon naturally speaking software and may contain errors in syntax, spelling, or content which have not been identified prior to signing this note.

## 2023-11-23 ENCOUNTER — Ambulatory Visit
